# Patient Record
Sex: Male | Born: 1965 | Race: White | Hispanic: Yes | Smoking: Current every day smoker
Health system: Southern US, Community
[De-identification: ages and names within clinical notes are randomized; demographics above are authoritative.]

## PROBLEM LIST (undated history)

## (undated) DIAGNOSIS — K429 Umbilical hernia without obstruction or gangrene: Secondary | ICD-10-CM

## (undated) DIAGNOSIS — Z8719 Personal history of other diseases of the digestive system: Secondary | ICD-10-CM

## (undated) DIAGNOSIS — S92402A Displaced unspecified fracture of left great toe, initial encounter for closed fracture: Secondary | ICD-10-CM

## (undated) DIAGNOSIS — M24569 Contracture, unspecified knee: Secondary | ICD-10-CM

## (undated) DIAGNOSIS — Z9889 Other specified postprocedural states: Secondary | ICD-10-CM

## (undated) DIAGNOSIS — E349 Endocrine disorder, unspecified: Secondary | ICD-10-CM

## (undated) HISTORY — PX: HERNIA REPAIR: SHX51

## (undated) HISTORY — PX: ROTATOR CUFF REPAIR: SHX139

---

## 1999-01-19 ENCOUNTER — Ambulatory Visit (HOSPITAL_BASED_OUTPATIENT_CLINIC_OR_DEPARTMENT_OTHER): Admission: RE | Admit: 1999-01-19 | Discharge: 1999-01-19 | Payer: Self-pay | Admitting: *Deleted

## 2008-01-17 ENCOUNTER — Emergency Department (HOSPITAL_COMMUNITY): Admission: EM | Admit: 2008-01-17 | Discharge: 2008-01-17 | Payer: Self-pay | Admitting: Emergency Medicine

## 2011-06-12 ENCOUNTER — Encounter (HOSPITAL_COMMUNITY): Payer: Self-pay | Admitting: Emergency Medicine

## 2011-06-12 ENCOUNTER — Emergency Department (HOSPITAL_COMMUNITY): Payer: Managed Care, Other (non HMO)

## 2011-06-12 ENCOUNTER — Emergency Department (HOSPITAL_COMMUNITY)
Admission: EM | Admit: 2011-06-12 | Discharge: 2011-06-12 | Disposition: A | Discharge status: Home / Self Care | Payer: Managed Care, Other (non HMO) | Attending: Emergency Medicine | Admitting: Emergency Medicine

## 2011-06-12 DIAGNOSIS — M2420 Disorder of ligament, unspecified site: Secondary | ICD-10-CM | POA: Insufficient documentation

## 2011-06-12 DIAGNOSIS — F17200 Nicotine dependence, unspecified, uncomplicated: Secondary | ICD-10-CM | POA: Insufficient documentation

## 2011-06-12 DIAGNOSIS — M24212 Disorder of ligament, left shoulder: Secondary | ICD-10-CM

## 2011-06-12 DIAGNOSIS — M25519 Pain in unspecified shoulder: Secondary | ICD-10-CM | POA: Insufficient documentation

## 2011-06-12 HISTORY — DX: Umbilical hernia without obstruction or gangrene: K42.9

## 2011-06-12 MED ORDER — IBUPROFEN 800 MG PO TABS
800.0000 mg | ORAL_TABLET | Freq: Once | ORAL | Status: AC
  Administered 2011-06-12: 800 mg via ORAL
  Filled 2011-06-12: qty 1

## 2011-06-12 MED ORDER — IBUPROFEN 800 MG PO TABS: 800.0000 mg | ORAL_TABLET | Freq: Three times a day (TID) | ORAL | Status: AC

## 2011-06-12 MED ORDER — OXYCODONE-ACETAMINOPHEN 5-325 MG PO TABS: 2.0000 | ORAL_TABLET | ORAL | Status: AC | PRN

## 2011-06-12 MED ORDER — OXYCODONE-ACETAMINOPHEN 5-325 MG PO TABS
1.0000 | ORAL_TABLET | Freq: Once | ORAL | Status: AC
  Administered 2011-06-12: 1 via ORAL
  Filled 2011-06-12: qty 1

## 2011-06-12 NOTE — ED Provider Notes (Signed)
History     CSN: [id number]  Arrival date & time 06/12/11  1710   First MD Initiated Contact with Patient 06/12/11 1727      Chief Complaint  Patient presents with  . Shoulder Pain    (Consider location/radiation/quality/duration/timing/severity/associated sxs/prior treatment) Patient is a 46 y.o. male presenting with shoulder pain. The history is provided by the patient. No language interpreter was used.  Shoulder Pain This is a new problem. The current episode started yesterday. The problem occurs constantly. The problem has been gradually worsening. Associated symptoms include joint swelling. Pertinent negatives include no chest pain, fever, neck pain, numbness, vomiting or weakness. Exacerbated by: abduction and adduction. He has tried ice for the symptoms. The treatment provided no relief.  Reports moving a generator in his garage last night around 6pm and now he can not raise his left arm.  Attempted to work today but ended up hurting the shoulder again and now he cant move the LUE at all without pain. Good pulse and sensation.   Past Medical History  Diagnosis Date  . Umbilical hernia     Past Surgical History  Procedure Date  . Hernia repair     No family history on file.  History  Substance Use Topics  . Smoking status: Current Everyday Smoker  . Smokeless tobacco: Not on file  . Alcohol Use: Yes      Review of Systems  Constitutional: Negative for fever.  HENT: Negative for neck pain.   Cardiovascular: Negative for chest pain.  Gastrointestinal: Negative for vomiting.  Musculoskeletal: Positive for joint swelling.  Neurological: Negative for weakness and numbness.  All other systems reviewed and are negative.    Allergies  Review of patient's allergies indicates no known allergies.  Home Medications  No current outpatient prescriptions on file.  BP 131/46  Pulse 85  Temp(Src) 98.8 F (37.1 C) (Oral)  SpO2 98%  Physical Exam  Nursing note and  vitals reviewed. Constitutional: He is oriented to person, place, and time. He appears well-developed and well-nourished.  HENT:  Head: Normocephalic.  Eyes: Pupils are equal, round, and reactive to light.  Neck: Normal range of motion.  Cardiovascular: Normal rate.   Pulmonary/Chest: Effort normal.  Musculoskeletal:       L shoulder tenderness.  Unable to raise extremity.  Good pulse and sensation.l  Neurological: He is alert and oriented to person, place, and time.  Skin: Skin is warm and dry.  Psychiatric: He has a normal mood and affect.    ED Course  Procedures (including critical care time)  Labs Reviewed - No data to display No results found.   No diagnosis found.    MDM  L Shoulder pain since last pm when he was moving a generator in his garage.  Reinjured today at work.  Shoulder immobilizer and ortho consult follow up.  Ibuprofen and percocet for pain.  2+ L radial  Pulse.  Good sensation to the extremity.         Jethro Bastos, NP 06/13/11 1914

## 2011-06-12 NOTE — ED Notes (Signed)
Patient transported to X-ray 

## 2011-06-14 NOTE — ED Provider Notes (Signed)
Medical screening examination/treatment/procedure(s) were performed by non-physician practitioner and as supervising physician I was immediately available for consultation/collaboration.  Waleed Dettman, MD 06/14/11 1400 

## 2014-09-30 ENCOUNTER — Emergency Department (HOSPITAL_COMMUNITY): Payer: 59

## 2014-09-30 ENCOUNTER — Emergency Department (HOSPITAL_COMMUNITY): Payer: 59 | Admitting: Anesthesiology

## 2014-09-30 ENCOUNTER — Encounter (HOSPITAL_COMMUNITY): Admission: EM | Disposition: A | Discharge status: Rehabilitation Facility | Payer: Self-pay | Source: Home / Self Care

## 2014-09-30 ENCOUNTER — Encounter (HOSPITAL_COMMUNITY): Payer: Self-pay | Admitting: Radiology

## 2014-09-30 ENCOUNTER — Inpatient Hospital Stay (HOSPITAL_COMMUNITY)
Admission: EM | Admit: 2014-09-30 | Discharge: 2014-10-07 | DRG: 956 | Disposition: A | Discharge status: Rehabilitation Facility | Payer: 59 | Attending: General Surgery | Admitting: General Surgery

## 2014-09-30 DIAGNOSIS — S27321A Contusion of lung, unilateral, initial encounter: Secondary | ICD-10-CM | POA: Diagnosis present

## 2014-09-30 DIAGNOSIS — D62 Acute posthemorrhagic anemia: Secondary | ICD-10-CM | POA: Diagnosis present

## 2014-09-30 DIAGNOSIS — Z419 Encounter for procedure for purposes other than remedying health state, unspecified: Secondary | ICD-10-CM

## 2014-09-30 DIAGNOSIS — R300 Dysuria: Secondary | ICD-10-CM | POA: Diagnosis not present

## 2014-09-30 DIAGNOSIS — E559 Vitamin D deficiency, unspecified: Secondary | ICD-10-CM | POA: Diagnosis present

## 2014-09-30 DIAGNOSIS — Y92410 Unspecified street and highway as the place of occurrence of the external cause: Secondary | ICD-10-CM

## 2014-09-30 DIAGNOSIS — R000 Tachycardia, unspecified: Secondary | ICD-10-CM | POA: Diagnosis present

## 2014-09-30 DIAGNOSIS — Z23 Encounter for immunization: Secondary | ICD-10-CM | POA: Diagnosis not present

## 2014-09-30 DIAGNOSIS — S7292XS Unspecified fracture of left femur, sequela: Secondary | ICD-10-CM | POA: Diagnosis not present

## 2014-09-30 DIAGNOSIS — S32409A Unspecified fracture of unspecified acetabulum, initial encounter for closed fracture: Secondary | ICD-10-CM

## 2014-09-30 DIAGNOSIS — S32462A Displaced associated transverse-posterior fracture of left acetabulum, initial encounter for closed fracture: Secondary | ICD-10-CM | POA: Diagnosis present

## 2014-09-30 DIAGNOSIS — T1490 Injury, unspecified: Secondary | ICD-10-CM | POA: Diagnosis present

## 2014-09-30 DIAGNOSIS — F17210 Nicotine dependence, cigarettes, uncomplicated: Secondary | ICD-10-CM | POA: Diagnosis present

## 2014-09-30 DIAGNOSIS — S72402C Unspecified fracture of lower end of left femur, initial encounter for open fracture type IIIA, IIIB, or IIIC: Secondary | ICD-10-CM

## 2014-09-30 DIAGNOSIS — M79641 Pain in right hand: Secondary | ICD-10-CM | POA: Diagnosis present

## 2014-09-30 DIAGNOSIS — R52 Pain, unspecified: Secondary | ICD-10-CM

## 2014-09-30 DIAGNOSIS — M6100 Myositis ossificans traumatica, unspecified site: Secondary | ICD-10-CM | POA: Diagnosis present

## 2014-09-30 DIAGNOSIS — M6149 Other calcification of muscle, multiple sites: Secondary | ICD-10-CM | POA: Diagnosis present

## 2014-09-30 DIAGNOSIS — T1490XA Injury, unspecified, initial encounter: Secondary | ICD-10-CM

## 2014-09-30 DIAGNOSIS — S72402B Unspecified fracture of lower end of left femur, initial encounter for open fracture type I or II: Principal | ICD-10-CM | POA: Diagnosis present

## 2014-09-30 DIAGNOSIS — S72409A Unspecified fracture of lower end of unspecified femur, initial encounter for closed fracture: Secondary | ICD-10-CM

## 2014-09-30 DIAGNOSIS — S32402A Unspecified fracture of left acetabulum, initial encounter for closed fracture: Secondary | ICD-10-CM

## 2014-09-30 DIAGNOSIS — E871 Hypo-osmolality and hyponatremia: Secondary | ICD-10-CM | POA: Diagnosis not present

## 2014-09-30 DIAGNOSIS — S3692XA Contusion of unspecified intra-abdominal organ, initial encounter: Secondary | ICD-10-CM | POA: Diagnosis present

## 2014-09-30 DIAGNOSIS — S32402S Unspecified fracture of left acetabulum, sequela: Secondary | ICD-10-CM | POA: Diagnosis not present

## 2014-09-30 DIAGNOSIS — I872 Venous insufficiency (chronic) (peripheral): Secondary | ICD-10-CM | POA: Diagnosis present

## 2014-09-30 DIAGNOSIS — S7292XB Unspecified fracture of left femur, initial encounter for open fracture type I or II: Secondary | ICD-10-CM | POA: Diagnosis present

## 2014-09-30 HISTORY — DX: Displaced unspecified fracture of left great toe, initial encounter for closed fracture: S92.402A

## 2014-09-30 HISTORY — DX: Personal history of other diseases of the digestive system: Z87.19

## 2014-09-30 HISTORY — DX: Other specified postprocedural states: Z98.890

## 2014-09-30 HISTORY — PX: I & D EXTREMITY: SHX5045

## 2014-09-30 HISTORY — PX: EXTERNAL FIXATION LEG: SHX1549

## 2014-09-30 HISTORY — PX: HIP CLOSED REDUCTION: SHX983

## 2014-09-30 LAB — PREPARE FRESH FROZEN PLASMA
Unit division: 0
Unit division: 0

## 2014-09-30 LAB — CBC WITH DIFFERENTIAL/PLATELET
BASOS ABS: 0 10*3/uL (ref 0.0–0.1)
Basophils Relative: 0 % (ref 0–1)
Eosinophils Absolute: 0.2 10*3/uL (ref 0.0–0.7)
Eosinophils Relative: 2 % (ref 0–5)
HCT: 25.5 % — ABNORMAL LOW (ref 39.0–52.0)
Hemoglobin: 7.9 g/dL — ABNORMAL LOW (ref 13.0–17.0)
LYMPHS ABS: 1.2 10*3/uL (ref 0.7–4.0)
Lymphocytes Relative: 13 % (ref 12–46)
MCH: 22.8 pg — AB (ref 26.0–34.0)
MCHC: 31 g/dL (ref 30.0–36.0)
MCV: 73.7 fL — ABNORMAL LOW (ref 78.0–100.0)
Monocytes Absolute: 0.7 10*3/uL (ref 0.1–1.0)
Monocytes Relative: 7 % (ref 3–12)
NEUTROS PCT: 78 % — AB (ref 43–77)
Neutro Abs: 7.3 10*3/uL (ref 1.7–7.7)
PLATELETS: 317 10*3/uL (ref 150–400)
RBC: 3.46 MIL/uL — AB (ref 4.22–5.81)
RDW: 17.8 % — AB (ref 11.5–15.5)
WBC: 9.4 10*3/uL (ref 4.0–10.5)

## 2014-09-30 LAB — URINALYSIS, ROUTINE W REFLEX MICROSCOPIC
BILIRUBIN URINE: NEGATIVE
Glucose, UA: NEGATIVE mg/dL
Ketones, ur: NEGATIVE mg/dL
Leukocytes, UA: NEGATIVE
Nitrite: NEGATIVE
PROTEIN: NEGATIVE mg/dL
Specific Gravity, Urine: 1.003 — ABNORMAL LOW (ref 1.005–1.030)
UROBILINOGEN UA: 0.2 mg/dL (ref 0.0–1.0)
pH: 6.5 (ref 5.0–8.0)

## 2014-09-30 LAB — COMPREHENSIVE METABOLIC PANEL
ALBUMIN: 3.8 g/dL (ref 3.5–5.0)
ALT: 93 U/L — AB (ref 17–63)
AST: 312 U/L — AB (ref 15–41)
Alkaline Phosphatase: 57 U/L (ref 38–126)
Anion gap: 13 (ref 5–15)
BUN: 7 mg/dL (ref 6–20)
CALCIUM: 8.6 mg/dL — AB (ref 8.9–10.3)
CO2: 19 mmol/L — AB (ref 22–32)
CREATININE: 0.88 mg/dL (ref 0.61–1.24)
Chloride: 106 mmol/L (ref 101–111)
GFR calc Af Amer: >60 mL/min (ref >60)
Glucose, Bld: 114 mg/dL — ABNORMAL HIGH (ref 70–99)
Potassium: 3.4 mmol/L — ABNORMAL LOW (ref 3.5–5.1)
SODIUM: 138 mmol/L (ref 135–145)
TOTAL PROTEIN: 6.4 g/dL — AB (ref 6.5–8.1)
Total Bilirubin: 0.5 mg/dL (ref 0.3–1.2)

## 2014-09-30 LAB — RAPID URINE DRUG SCREEN, HOSP PERFORMED
Amphetamines: NOT DETECTED
Barbiturates: NOT DETECTED
Benzodiazepines: NOT DETECTED
Cocaine: NOT DETECTED
OPIATES: NOT DETECTED
Tetrahydrocannabinol: POSITIVE — AB

## 2014-09-30 LAB — PROTIME-INR
INR: 1.07 (ref 0.00–1.49)
Prothrombin Time: 14.1 seconds (ref 11.6–15.2)

## 2014-09-30 LAB — ABO/RH: ABO/RH(D): A POS

## 2014-09-30 LAB — ETHANOL: ALCOHOL ETHYL (B): 92 mg/dL — AB (ref <5)

## 2014-09-30 LAB — URINE MICROSCOPIC-ADD ON

## 2014-09-30 LAB — CDS SEROLOGY

## 2014-09-30 LAB — I-STAT CG4 LACTIC ACID, ED: LACTIC ACID, VENOUS: 3.09 mmol/L — AB (ref 0.5–2.0)

## 2014-09-30 SURGERY — IRRIGATION AND DEBRIDEMENT EXTREMITY
Anesthesia: General | Site: Leg Upper | Laterality: Left

## 2014-09-30 MED ORDER — KCL IN DEXTROSE-NACL 20-5-0.45 MEQ/L-%-% IV SOLN
INTRAVENOUS | Status: DC
  Administered 2014-10-01 – 2014-10-07 (×11): via INTRAVENOUS
  Filled 2014-09-30 (×16): qty 1000

## 2014-09-30 MED ORDER — SODIUM CHLORIDE 0.9 % IR SOLN
Status: DC | PRN
  Administered 2014-09-30: 1000 mL
  Administered 2014-09-30: 3000 mL

## 2014-09-30 MED ORDER — MIDAZOLAM HCL 2 MG/2ML IJ SOLN
INTRAMUSCULAR | Status: DC | PRN
  Administered 2014-09-30: 2 mg via INTRAVENOUS

## 2014-09-30 MED ORDER — ONDANSETRON HCL 4 MG/2ML IJ SOLN: 4.0000 mg | Freq: Four times a day (QID) | INTRAMUSCULAR | Status: DC | PRN

## 2014-09-30 MED ORDER — FENTANYL CITRATE (PF) 250 MCG/5ML IJ SOLN
INTRAMUSCULAR | Status: DC | PRN
  Administered 2014-09-30: 100 ug via INTRAVENOUS
  Administered 2014-09-30: 50 ug via INTRAVENOUS

## 2014-09-30 MED ORDER — IOHEXOL 300 MG/ML  SOLN
100.0000 mL | Freq: Once | INTRAMUSCULAR | Status: AC | PRN
  Administered 2014-09-30: 100 mL via INTRAVENOUS

## 2014-09-30 MED ORDER — OXYCODONE HCL 5 MG PO TABS: 5.0000 mg | ORAL_TABLET | Freq: Once | ORAL | Status: AC | PRN

## 2014-09-30 MED ORDER — SODIUM CHLORIDE 0.9 % IJ SOLN: 9.0000 mL | INTRAMUSCULAR | Status: DC | PRN

## 2014-09-30 MED ORDER — HYDROMORPHONE HCL 1 MG/ML IJ SOLN
INTRAMUSCULAR | Status: DC
Start: 2014-09-30 — End: 2014-10-01
  Filled 2014-09-30: qty 1

## 2014-09-30 MED ORDER — SUCCINYLCHOLINE CHLORIDE 20 MG/ML IJ SOLN
INTRAMUSCULAR | Status: DC | PRN
  Administered 2014-09-30: 100 mg via INTRAVENOUS

## 2014-09-30 MED ORDER — HYDROMORPHONE HCL 1 MG/ML IJ SOLN
1.0000 mg | Freq: Once | INTRAMUSCULAR | Status: AC
  Administered 2014-09-30: 1 mg via INTRAVENOUS

## 2014-09-30 MED ORDER — DIPHENHYDRAMINE HCL 12.5 MG/5ML PO ELIX
12.5000 mg | ORAL_SOLUTION | Freq: Four times a day (QID) | ORAL | Status: DC | PRN
  Filled 2014-09-30: qty 5

## 2014-09-30 MED ORDER — MORPHINE SULFATE (PF) 1 MG/ML IV SOLN
INTRAVENOUS | Status: AC
  Filled 2014-09-30: qty 25

## 2014-09-30 MED ORDER — HYDROMORPHONE HCL 1 MG/ML IJ SOLN
0.2500 mg | INTRAMUSCULAR | Status: DC | PRN
  Administered 2014-10-01 (×2): 0.5 mg via INTRAVENOUS

## 2014-09-30 MED ORDER — MORPHINE SULFATE (PF) 1 MG/ML IV SOLN
INTRAVENOUS | Status: DC
  Administered 2014-10-01: via INTRAVENOUS

## 2014-09-30 MED ORDER — TETANUS-DIPHTH-ACELL PERTUSSIS 5-2.5-18.5 LF-MCG/0.5 IM SUSP
INTRAMUSCULAR | Status: AC
Start: 2014-09-30 — End: 2014-09-30
  Administered 2014-09-30: 0.5 mL via INTRAMUSCULAR
  Filled 2014-09-30: qty 0.5

## 2014-09-30 MED ORDER — PROPOFOL 10 MG/ML IV BOLUS
INTRAVENOUS | Status: DC | PRN
  Administered 2014-09-30: 180 mg via INTRAVENOUS

## 2014-09-30 MED ORDER — ONDANSETRON HCL 4 MG/2ML IJ SOLN
INTRAMUSCULAR | Status: AC
  Filled 2014-09-30: qty 2

## 2014-09-30 MED ORDER — OXYCODONE HCL 5 MG/5ML PO SOLN: 5.0000 mg | Freq: Once | ORAL | Status: AC | PRN

## 2014-09-30 MED ORDER — HYDROMORPHONE HCL 1 MG/ML IJ SOLN
INTRAMUSCULAR | Status: AC
  Filled 2014-09-30: qty 1

## 2014-09-30 MED ORDER — DIPHENHYDRAMINE HCL 50 MG/ML IJ SOLN: 12.5000 mg | Freq: Four times a day (QID) | INTRAMUSCULAR | Status: DC | PRN

## 2014-09-30 MED ORDER — KCL IN DEXTROSE-NACL 20-5-0.45 MEQ/L-%-% IV SOLN
INTRAVENOUS | Status: AC
  Filled 2014-09-30: qty 1000

## 2014-09-30 MED ORDER — SUCCINYLCHOLINE CHLORIDE 20 MG/ML IJ SOLN
INTRAMUSCULAR | Status: AC
  Filled 2014-09-30: qty 1

## 2014-09-30 MED ORDER — CEFAZOLIN SODIUM 1-5 GM-% IV SOLN
1.0000 g | Freq: Once | INTRAVENOUS | Status: AC
  Administered 2014-09-30: 1 g via INTRAVENOUS
  Filled 2014-09-30: qty 50

## 2014-09-30 MED ORDER — LIDOCAINE HCL (CARDIAC) 20 MG/ML IV SOLN
INTRAVENOUS | Status: AC
Start: 2014-09-30 — End: 2014-09-30
  Filled 2014-09-30: qty 5

## 2014-09-30 MED ORDER — NALOXONE HCL 0.4 MG/ML IJ SOLN: 0.4000 mg | INTRAMUSCULAR | Status: DC | PRN

## 2014-09-30 MED ORDER — LACTATED RINGERS IV SOLN
INTRAVENOUS | Status: DC | PRN
  Administered 2014-09-30 (×2): via INTRAVENOUS

## 2014-09-30 MED ORDER — MIDAZOLAM HCL 2 MG/2ML IJ SOLN
INTRAMUSCULAR | Status: AC
  Filled 2014-09-30: qty 2

## 2014-09-30 MED ORDER — CEFAZOLIN SODIUM-DEXTROSE 2-3 GM-% IV SOLR
INTRAVENOUS | Status: DC | PRN
  Administered 2014-09-30: 2 g via INTRAVENOUS

## 2014-09-30 MED ORDER — FENTANYL CITRATE (PF) 250 MCG/5ML IJ SOLN
INTRAMUSCULAR | Status: AC
  Filled 2014-09-30: qty 5

## 2014-09-30 MED ORDER — HYDROMORPHONE HCL 1 MG/ML IJ SOLN
INTRAMUSCULAR | Status: AC
  Administered 2014-09-30: 1 mg
  Filled 2014-09-30: qty 1

## 2014-09-30 MED ORDER — LIDOCAINE HCL (CARDIAC) 20 MG/ML IV SOLN
INTRAVENOUS | Status: DC | PRN
  Administered 2014-09-30: 60 mg via INTRAVENOUS

## 2014-09-30 MED ORDER — ONDANSETRON HCL 4 MG/2ML IJ SOLN
INTRAMUSCULAR | Status: DC | PRN
  Administered 2014-09-30: 4 mg via INTRAVENOUS

## 2014-09-30 MED ORDER — SODIUM CHLORIDE 0.9 % IV SOLN
Freq: Once | INTRAVENOUS | Status: AC
  Administered 2014-09-30: 20:00:00 via INTRAVENOUS

## 2014-09-30 MED ORDER — PROPOFOL 10 MG/ML IV BOLUS
INTRAVENOUS | Status: AC
  Filled 2014-09-30: qty 20

## 2014-09-30 SURGICAL SUPPLY — 80 items
5MM HALF PIN 200 X 45 ×4 IMPLANT
BANDAGE ELASTIC 4 VELCRO ST LF (GAUZE/BANDAGES/DRESSINGS) ×4 IMPLANT
BANDAGE ELASTIC 6 VELCRO ST LF (GAUZE/BANDAGES/DRESSINGS) ×4 IMPLANT
BANDAGE ESMARK 6X9 LF (GAUZE/BANDAGES/DRESSINGS) ×2 IMPLANT
BAR EXFX 600X11 NS LF (MISCELLANEOUS) ×6
BAR GLASS FIBER EXFX 11X600 (MISCELLANEOUS) ×6 IMPLANT
BNDG CMPR 9X6 STRL LF SNTH (GAUZE/BANDAGES/DRESSINGS)
BNDG COHESIVE 6X5 TAN STRL LF (GAUZE/BANDAGES/DRESSINGS) ×4 IMPLANT
BNDG ESMARK 6X9 LF (GAUZE/BANDAGES/DRESSINGS)
BNDG GAUZE ELAST 4 BULKY (GAUZE/BANDAGES/DRESSINGS) ×8 IMPLANT
BOOTCOVER CLEANROOM LRG (PROTECTIVE WEAR) ×4 IMPLANT
BRUSH SCRUB DISP (MISCELLANEOUS) ×4 IMPLANT
CLEANER TIP ELECTROSURG 2X2 (MISCELLANEOUS) ×4 IMPLANT
CLOSURE WOUND 1/2 X4 (GAUZE/BANDAGES/DRESSINGS)
COVER SURGICAL LIGHT HANDLE (MISCELLANEOUS) ×6 IMPLANT
CUFF TOURNIQUET SINGLE 18IN (TOURNIQUET CUFF) IMPLANT
CUFF TOURNIQUET SINGLE 24IN (TOURNIQUET CUFF) IMPLANT
CUFF TOURNIQUET SINGLE 34IN LL (TOURNIQUET CUFF) IMPLANT
DRAPE C-ARM 42X72 X-RAY (DRAPES) ×2 IMPLANT
DRAPE C-ARMOR (DRAPES) ×2 IMPLANT
DRAPE U-SHAPE 47X51 STRL (DRAPES) ×4 IMPLANT
DRSG ADAPTIC 3X8 NADH LF (GAUZE/BANDAGES/DRESSINGS) ×4 IMPLANT
DRSG MEPILEX BORDER 4X8 (GAUZE/BANDAGES/DRESSINGS) IMPLANT
DRSG PAD ABDOMINAL 8X10 ST (GAUZE/BANDAGES/DRESSINGS) ×6 IMPLANT
DURAPREP 26ML APPLICATOR (WOUND CARE) ×4 IMPLANT
ELECT REM PT RETURN 9FT ADLT (ELECTROSURGICAL) ×4
ELECTRODE REM PT RTRN 9FT ADLT (ELECTROSURGICAL) ×2 IMPLANT
EVACUATOR 1/8 PVC DRAIN (DRAIN) IMPLANT
GAUZE SPONGE 4X4 12PLY STRL (GAUZE/BANDAGES/DRESSINGS) ×4 IMPLANT
GAUZE XEROFORM 1X8 LF (GAUZE/BANDAGES/DRESSINGS) ×2 IMPLANT
GAUZE XEROFORM 5X9 LF (GAUZE/BANDAGES/DRESSINGS) ×2 IMPLANT
GLOVE BIO SURGEON STRL SZ7.5 (GLOVE) ×2 IMPLANT
GLOVE BIO SURGEON STRL SZ8 (GLOVE) ×8 IMPLANT
GLOVE BIOGEL PI IND STRL 7.5 (GLOVE) ×2 IMPLANT
GLOVE BIOGEL PI IND STRL 8 (GLOVE) ×2 IMPLANT
GLOVE BIOGEL PI INDICATOR 7.5 (GLOVE)
GLOVE BIOGEL PI INDICATOR 8 (GLOVE)
GOWN STRL REUS W/ TWL LRG LVL3 (GOWN DISPOSABLE) ×4 IMPLANT
GOWN STRL REUS W/ TWL XL LVL3 (GOWN DISPOSABLE) ×2 IMPLANT
GOWN STRL REUS W/TWL LRG LVL3 (GOWN DISPOSABLE)
GOWN STRL REUS W/TWL XL LVL3 (GOWN DISPOSABLE) ×4
HANDPIECE INTERPULSE COAX TIP (DISPOSABLE)
KIT BASIN OR (CUSTOM PROCEDURE TRAY) ×4 IMPLANT
KIT ROOM TURNOVER OR (KITS) ×4 IMPLANT
MANIFOLD NEPTUNE II (INSTRUMENTS) ×4 IMPLANT
NEEDLE 22X1 1/2 (OR ONLY) (NEEDLE) IMPLANT
NS IRRIG 1000ML POUR BTL (IV SOLUTION) ×4 IMPLANT
PACK ORTHO EXTREMITY (CUSTOM PROCEDURE TRAY) ×4 IMPLANT
PACK SHOULDER (CUSTOM PROCEDURE TRAY) ×2 IMPLANT
PAD ABD 8X10 STRL (GAUZE/BANDAGES/DRESSINGS) ×2 IMPLANT
PAD ARMBOARD 7.5X6 YLW CONV (MISCELLANEOUS) ×8 IMPLANT
PADDING CAST COTTON 6X4 STRL (CAST SUPPLIES) ×6 IMPLANT
PIN CLAMP 2BAR 75MM BLUE (PIN) ×4 IMPLANT
PIN HALF YELLOW 5X160X35 (PIN) ×4 IMPLANT
SET HNDPC FAN SPRY TIP SCT (DISPOSABLE) IMPLANT
SPONGE GAUZE 4X4 12PLY STER LF (GAUZE/BANDAGES/DRESSINGS) ×4 IMPLANT
SPONGE LAP 18X18 X RAY DECT (DISPOSABLE) ×4 IMPLANT
SPONGE SCRUB IODOPHOR (GAUZE/BANDAGES/DRESSINGS) ×4 IMPLANT
STAPLER VISISTAT 35W (STAPLE) IMPLANT
STOCKINETTE IMPERVIOUS LG (DRAPES) ×4 IMPLANT
STRIP CLOSURE SKIN 1/2X4 (GAUZE/BANDAGES/DRESSINGS) IMPLANT
SUCTION FRAZIER TIP 10 FR DISP (SUCTIONS) IMPLANT
SUPPORT WRAP ARM LG (MISCELLANEOUS) IMPLANT
SUT CHROMIC 3 0 SH 27 (SUTURE) ×2 IMPLANT
SUT ETHILON 2 0 PSLX (SUTURE) ×4 IMPLANT
SUT ETHILON 3 0 PS 1 (SUTURE) IMPLANT
SUT VIC AB 0 CT1 27 (SUTURE)
SUT VIC AB 0 CT1 27XBRD ANBCTR (SUTURE) ×4 IMPLANT
SUT VIC AB 2-0 CT1 27 (SUTURE)
SUT VIC AB 2-0 CT1 TAPERPNT 27 (SUTURE) ×4 IMPLANT
SWAB COLLECTION DEVICE MRSA (MISCELLANEOUS) ×2 IMPLANT
SYR CONTROL 10ML LL (SYRINGE) IMPLANT
TOWEL OR 17X24 6PK STRL BLUE (TOWEL DISPOSABLE) ×10 IMPLANT
TOWEL OR 17X26 10 PK STRL BLUE (TOWEL DISPOSABLE) ×8 IMPLANT
TUBE ANAEROBIC SPECIMEN COL (MISCELLANEOUS) IMPLANT
TUBE CONNECTING 12'X1/4 (SUCTIONS) ×1
TUBE CONNECTING 12X1/4 (SUCTIONS) ×3 IMPLANT
UNDERPAD 30X30 INCONTINENT (UNDERPADS AND DIAPERS) ×4 IMPLANT
WATER STERILE IRR 1000ML POUR (IV SOLUTION) ×4 IMPLANT
YANKAUER SUCT BULB TIP NO VENT (SUCTIONS) ×4 IMPLANT
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
## 2014-09-30 NOTE — ED Notes (Signed)
Urine return in foley bag appears bloody.

## 2014-09-30 NOTE — ED Notes (Signed)
Pt to CT with RN on monitor.

## 2014-09-30 NOTE — ED Notes (Signed)
500 ml bolus infusing.
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
## 2014-09-30 NOTE — Anesthesia Procedure Notes (Signed)
Procedure Name: Intubation Date/Time: 09/30/2014 9:47 PM Performed by: Molli HazardGORDON, Doloris Servantes M Pre-anesthesia Checklist: Patient identified, Emergency Drugs available, Suction available and Patient being monitored Patient Re-evaluated:Patient Re-evaluated prior to inductionOxygen Delivery Method: Circle system utilized Preoxygenation: Pre-oxygenation with 100% oxygen Intubation Type: IV induction, Rapid sequence and Cricoid Pressure applied Laryngoscope Size: Miller and 2 Grade View: Grade I Tube type: Oral Tube size: 7.5 mm Number of attempts: 1 Airway Equipment and Method: Stylet Placement Confirmation: ETT inserted through vocal cords under direct vision,  positive ETCO2 and breath sounds checked- equal and bilateral Secured at: 23 cm Tube secured with: Tape Dental Injury: Teeth and Oropharynx as per pre-operative assessment
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
## 2014-09-30 NOTE — ED Notes (Signed)
Return from ct  Pt remains alert and oriented.

## 2014-09-30 NOTE — ED Notes (Signed)
Pt;s clothes taken with pt to OR

## 2014-09-30 NOTE — ED Notes (Signed)
Warm blankets applied to pt and temp turned up in trauma room

## 2014-09-30 NOTE — OR Nursing (Signed)
Pt removed disposable contract lenses and discarded them. cdb

## 2014-09-30 NOTE — ED Notes (Signed)
Hare traction splint being applied to left leg by Dr. Lindie SpruceWyatt and ortho tech at this time
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
## 2014-09-30 NOTE — ED Notes (Signed)
C-collar removed by Dr. Lindie SpruceWyatt.

## 2014-09-30 NOTE — Brief Op Note (Signed)
09/30/2014  11:33 PM  PATIENT:  Trixie Rudeichard Zarling  49 y.o. male  PRE-OPERATIVE DIAGNOSIS:  Open left Femur Fracture Dislocated Left Hip Fracture  POST-OPERATIVE DIAGNOSIS:  Open Left Femur Fracture, Dislocated left hip fracture  PROCEDURE:  Procedure(s): IRRIGATION AND DEBRIDEMENT EXTREMITY (Left) EXTERNAL FIXATION LEG (Left) CLOSED REDUCTION HIP (Left)  SURGEON:  Surgeon(s) and Role:    * Samson FredericBrian Dezyrae Kensinger, MD - Primary  PHYSICIAN ASSISTANT: Alphonsa OverallBrad Dixon, PA  ANESTHESIA:   general  EBL:  Total I/O In: 2100 [I.V.:2100] Out: 600 [Urine:600]  BLOOD ADMINISTERED:none  DRAINS: none   LOCAL MEDICATIONS USED:  NONE  SPECIMEN:  No Specimen  DISPOSITION OF SPECIMEN:  N/A  COUNTS:  YES  TOURNIQUET:  * No tourniquets in log *  DICTATION: .Other Dictation: Dictation Number [id number]  PLAN OF CARE: Admit to inpatient   PATIENT DISPOSITION:  PACU - hemodynamically stable.   Delay start of Pharmacological VTE agent (>24hrs) due to surgical blood loss or risk of bleeding: no

## 2014-09-30 NOTE — Transfer of Care (Signed)
Immediate Anesthesia Transfer of Care Note  Patient: [name] [name]  Procedure(s) Performed: Procedure(s): IRRIGATION AND DEBRIDEMENT EXTREMITY (Left) EXTERNAL FIXATION LEG (Left) CLOSED REDUCTION HIP (Left)  Patient Location: PACU  Anesthesia Type:General  Level of Consciousness: sedated and patient cooperative  Airway & Oxygen Therapy: Patient connected to nasal cannula oxygen  Post-op Assessment: Report given to RN and Post -op Vital signs reviewed and stable  Post vital signs: Reviewed and stable  Last Vitals:  Filed Vitals:   09/30/14 2345  BP: 139/80  Pulse: 125  Temp: 36.7 C  Resp: 19    Complications: No apparent anesthesia complications

## 2014-09-30 NOTE — ED Notes (Signed)
1 gold colored earing with clear stone, 1 black wallet, 1  $20.00 bill locked in safe

## 2014-09-30 NOTE — ED Notes (Signed)
To CT at this time.

## 2014-09-30 NOTE — ED Notes (Signed)
Pt remains alert and oriented.  Pedal pulse remains strong in left leg.
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
## 2014-09-30 NOTE — Anesthesia Preprocedure Evaluation (Signed)
Anesthesia Evaluation  Patient identified by MRN, date of birth, ID band Patient awake    Reviewed: Allergy & Precautions, NPO status , Patient's Chart, lab work & pertinent test results  Airway Mallampati: II   Neck ROM: full    Dental   Pulmonary Current Smoker,    breath sounds clear to auscultation       Cardiovascular negative cardio ROS   Rhythm:regular Rate:Normal     Neuro/Psych    GI/Hepatic   Endo/Other    Renal/GU      Musculoskeletal   Abdominal   Peds  Hematology   Anesthesia Other Findings   Reproductive/Obstetrics                             Anesthesia Physical Anesthesia Plan  ASA: II and emergent  Anesthesia Plan: General   Post-op Pain Management:    Induction: Intravenous  Airway Management Planned: Oral ETT  Additional Equipment:   Intra-op Plan:   Post-operative Plan: Extubation in OR  Informed Consent: I have reviewed the patients History and Physical, chart, labs and discussed the procedure including the risks, benefits and alternatives for the proposed anesthesia with the patient or authorized representative who has indicated his/her understanding and acceptance.     Plan Discussed with: CRNA, Anesthesiologist and Surgeon  Anesthesia Plan Comments:         Anesthesia Quick Evaluation  

## 2014-10-01 ENCOUNTER — Inpatient Hospital Stay (HOSPITAL_COMMUNITY): Payer: 59

## 2014-10-01 ENCOUNTER — Encounter (HOSPITAL_COMMUNITY): Payer: Self-pay | Admitting: *Deleted

## 2014-10-01 LAB — CBC
HCT: 30.7 % — ABNORMAL LOW (ref 39.0–52.0)
HCT: 28.6 % — ABNORMAL LOW (ref 39.0–52.0)
HEMOGLOBIN: 10.9 g/dL — AB (ref 13.0–17.0)
Hemoglobin: 9.7 g/dL — ABNORMAL LOW (ref 13.0–17.0)
MCH: 33.3 pg (ref 26.0–34.0)
MCH: 32.2 pg (ref 26.0–34.0)
MCHC: 35.5 g/dL (ref 30.0–36.0)
MCHC: 33.9 g/dL (ref 30.0–36.0)
MCV: 93.9 fL (ref 78.0–100.0)
MCV: 95 fL (ref 78.0–100.0)
Platelets: 246 10*3/uL (ref 150–400)
Platelets: 224 10*3/uL (ref 150–400)
RBC: 3.27 MIL/uL — AB (ref 4.22–5.81)
RBC: 3.01 MIL/uL — ABNORMAL LOW (ref 4.22–5.81)
RDW: 13.6 % (ref 11.5–15.5)
RDW: 13.7 % (ref 11.5–15.5)
WBC: 15.4 10*3/uL — ABNORMAL HIGH (ref 4.0–10.5)
WBC: 11.6 10*3/uL — ABNORMAL HIGH (ref 4.0–10.5)

## 2014-10-01 LAB — PROTIME-INR
INR: 1.08 (ref 0.00–1.49)
Prothrombin Time: 14.1 seconds (ref 11.6–15.2)

## 2014-10-01 LAB — BASIC METABOLIC PANEL
Anion gap: 8 (ref 5–15)
BUN: 6 mg/dL (ref 6–20)
CO2: 23 mmol/L (ref 22–32)
CREATININE: 0.83 mg/dL (ref 0.61–1.24)
Calcium: 7.6 mg/dL — ABNORMAL LOW (ref 8.9–10.3)
Chloride: 105 mmol/L (ref 101–111)
GFR calc Af Amer: >60 mL/min (ref >60)
Glucose, Bld: 208 mg/dL — ABNORMAL HIGH (ref 70–99)
Potassium: 4.3 mmol/L (ref 3.5–5.1)
SODIUM: 136 mmol/L (ref 135–145)

## 2014-10-01 LAB — MRSA PCR SCREENING: MRSA by PCR: NEGATIVE

## 2014-10-01 MED ORDER — ONDANSETRON HCL 4 MG/2ML IJ SOLN
4.0000 mg | Freq: Four times a day (QID) | INTRAMUSCULAR | Status: DC | PRN
Start: 2014-10-01 — End: 2014-10-07

## 2014-10-01 MED ORDER — METHOCARBAMOL 1000 MG/10ML IJ SOLN
1000.0000 mg | Freq: Four times a day (QID) | INTRAVENOUS | Status: DC
  Administered 2014-10-01 – 2014-10-07 (×20): 1000 mg via INTRAVENOUS
  Filled 2014-10-01 (×31): qty 10

## 2014-10-01 MED ORDER — DIPHENHYDRAMINE HCL 50 MG/ML IJ SOLN: 12.5000 mg | Freq: Four times a day (QID) | INTRAMUSCULAR | Status: DC | PRN

## 2014-10-01 MED ORDER — SODIUM CHLORIDE 0.9 % IJ SOLN: 9.0000 mL | INTRAMUSCULAR | Status: DC | PRN

## 2014-10-01 MED ORDER — HYDROMORPHONE 0.3 MG/ML IV SOLN
INTRAVENOUS | Status: AC
  Administered 2014-10-01: 1.5 mg
  Filled 2014-10-01: qty 25

## 2014-10-01 MED ORDER — CEFAZOLIN SODIUM 1-5 GM-% IV SOLN
1.0000 g | Freq: Four times a day (QID) | INTRAVENOUS | Status: DC
  Administered 2014-10-01 – 2014-10-02 (×8): 1 g via INTRAVENOUS
  Filled 2014-10-01 (×9): qty 50

## 2014-10-01 MED ORDER — NALOXONE HCL 0.4 MG/ML IJ SOLN: 0.4000 mg | INTRAMUSCULAR | Status: DC | PRN

## 2014-10-01 MED ORDER — METOCLOPRAMIDE HCL 5 MG PO TABS
5.0000 mg | ORAL_TABLET | Freq: Three times a day (TID) | ORAL | Status: DC | PRN
  Filled 2014-10-01: qty 2

## 2014-10-01 MED ORDER — HYDROMORPHONE 0.3 MG/ML IV SOLN
INTRAVENOUS | Status: DC
  Administered 2014-10-01: 3.9 mg via INTRAVENOUS
  Administered 2014-10-01: 17:00:00 via INTRAVENOUS
  Administered 2014-10-01: 3.9 mg via INTRAVENOUS
  Administered 2014-10-01: 3.74 mg via INTRAVENOUS
  Administered 2014-10-01: 3.6 mg via INTRAVENOUS
  Administered 2014-10-01 (×2): via INTRAVENOUS
  Administered 2014-10-01: 3.6 mg via INTRAVENOUS
  Administered 2014-10-02: 4.4 mg via INTRAVENOUS
  Administered 2014-10-02: 3.3 mg via INTRAVENOUS
  Administered 2014-10-02: 1.5 mg via INTRAVENOUS
  Administered 2014-10-02: 2.4 mg via INTRAVENOUS
  Administered 2014-10-03: 3.3 mg via INTRAVENOUS
  Administered 2014-10-03: 3.6 mg via INTRAVENOUS
  Administered 2014-10-03: 16:00:00 via INTRAVENOUS
  Administered 2014-10-03: 2.7 mg via INTRAVENOUS
  Administered 2014-10-03: 1.2 mg via INTRAVENOUS
  Administered 2014-10-03: 3.6 mg via INTRAVENOUS
  Administered 2014-10-04 (×2): via INTRAVENOUS
  Administered 2014-10-04 (×2): 1.8 mg via INTRAVENOUS
  Administered 2014-10-04: 2.7 mg via INTRAVENOUS
  Administered 2014-10-05 (×2): 1.8 mg via INTRAVENOUS
  Administered 2014-10-06: 1.2 mg via INTRAVENOUS
  Administered 2014-10-06: 3 mg via INTRAVENOUS
  Administered 2014-10-06: 1.2 mg via INTRAVENOUS
  Administered 2014-10-06: 0.3 mg via INTRAVENOUS
  Filled 2014-10-01 (×10): qty 25

## 2014-10-01 MED ORDER — ENOXAPARIN SODIUM 40 MG/0.4ML ~~LOC~~ SOLN
40.0000 mg | Freq: Every day | SUBCUTANEOUS | Status: DC
  Administered 2014-10-03 – 2014-10-07 (×5): 40 mg via SUBCUTANEOUS
  Filled 2014-10-01 (×4): qty 0.4

## 2014-10-01 MED ORDER — PNEUMOCOCCAL VAC POLYVALENT 25 MCG/0.5ML IJ INJ
0.5000 mL | INJECTION | INTRAMUSCULAR | Status: AC
  Administered 2014-10-02: 0.5 mL via INTRAMUSCULAR
  Filled 2014-10-01: qty 0.5

## 2014-10-01 MED ORDER — ENOXAPARIN SODIUM 40 MG/0.4ML ~~LOC~~ SOLN
40.0000 mg | Freq: Every day | SUBCUTANEOUS | Status: DC
  Filled 2014-10-01: qty 0.4

## 2014-10-01 MED ORDER — CEFAZOLIN SODIUM 1-5 GM-% IV SOLN
1.0000 g | Freq: Three times a day (TID) | INTRAVENOUS | Status: DC
  Filled 2014-10-01 (×2): qty 50

## 2014-10-01 MED ORDER — METOCLOPRAMIDE HCL 5 MG/ML IJ SOLN
5.0000 mg | Freq: Three times a day (TID) | INTRAMUSCULAR | Status: DC | PRN
  Filled 2014-10-01: qty 2

## 2014-10-01 MED ORDER — SODIUM CHLORIDE 0.9 % IV SOLN: INTRAVENOUS | Status: DC

## 2014-10-01 MED ORDER — DIPHENHYDRAMINE HCL 12.5 MG/5ML PO ELIX
12.5000 mg | ORAL_SOLUTION | Freq: Four times a day (QID) | ORAL | Status: DC | PRN
  Filled 2014-10-01: qty 5

## 2014-10-01 MED ORDER — OXYCODONE HCL 5 MG PO TABS
5.0000 mg | ORAL_TABLET | ORAL | Status: DC | PRN
  Administered 2014-10-01: 5 mg via ORAL
  Administered 2014-10-01 – 2014-10-06 (×22): 10 mg via ORAL
  Filled 2014-10-01 (×19): qty 2
  Filled 2014-10-01: qty 1
  Filled 2014-10-01 (×3): qty 2

## 2014-10-01 NOTE — Plan of Care (Signed)
Problem: Problem: Pain Management Progression Goal: Pain controlled Outcome: Progressing PCA dilaudid initiated. Goal: PAIN DECREASED WITH MOVEMENT OR ACTIVITY Outcome: Progressing Elevated affected extremity (L) on pillows. Turned on schedule.

## 2014-10-01 NOTE — Progress Notes (Signed)
Wasted in sink 12ml morphine PCA and witnessed by Danelle BerryJasmine Jones, RN.

## 2014-10-01 NOTE — Anesthesia Postprocedure Evaluation (Signed)
Anesthesia Post Note  Patient: [name] RudeRichard Bruhn  Procedure(s) Performed: Procedure(s) (LRB): IRRIGATION AND DEBRIDEMENT EXTREMITY (Left) EXTERNAL FIXATION LEG (Left) CLOSED REDUCTION HIP (Left)  Anesthesia type: General  Patient location: PACU  Post pain: Pain level controlled and Adequate analgesia  Post assessment: Post-op Vital signs reviewed, Patient's Cardiovascular Status Stable, Respiratory Function Stable, Patent Airway and Pain level controlled  Last Vitals:  Filed Vitals:   10/01/14 0500  BP: 142/72  Pulse:   Temp:   Resp: 11    Post vital signs: Reviewed and stable  Level of consciousness: awake, alert  and oriented  Complications: No apparent anesthesia complications

## 2014-10-01 NOTE — Progress Notes (Signed)
UR completed.  Pt to have further surgeries.  Await outcome of those and subsequent therapy evals to determine pt's next best level of care for discharge.   Carlyle LipaMichelle Dalissa Lovin, RN BSN [facility] CCM Trauma/Neuro ICU Case Manager [phone]

## 2014-10-01 NOTE — Progress Notes (Signed)
   Subjective:  Patient reports pain as moderate to severe.  States sensation to L foot is improved  Objective:   VITALS:   Filed Vitals:   10/01/14 0300 10/01/14 0400 10/01/14 0500 10/01/14 0600  BP: 132/81 127/68 142/72 131/68  Pulse:      Temp:  98.4 F (36.9 C)    TempSrc:  Oral    Resp: 15 13 11 10   Height:      Weight:      SpO2: 100% 100% 100% 100%    ABD soft Intact pulses distally Dorsiflexion/Plantar flexion intact Incision: dressing C/D/I Compartment soft sensation to dorsum of foot improved Ex fix intact, pinsites C/D/I  Lab Results  Component Value Date   WBC 15.4* 10/01/2014   HGB 10.9* 10/01/2014   HCT 30.7* 10/01/2014   MCV 93.9 10/01/2014   PLT 246 10/01/2014   BMET    Component Value Date/Time   NA 136 10/01/2014 0228   K 4.3 10/01/2014 0228   CL 105 10/01/2014 0228   CO2 23 10/01/2014 0228   GLUCOSE 208* 10/01/2014 0228   BUN 6 10/01/2014 0228   CREATININE 0.83 10/01/2014 0228   CALCIUM 7.6* 10/01/2014 0228   GFRNONAA >60 10/01/2014 0228   GFRAA >60 10/01/2014 0228     Assessment/Plan: 1 Day Post-Op   Active Problems:   Open femur fracture, left   Open femur fracture L transverse + posterior wall acetabular fx / dislocation   Bedrest for now DVT ppx: lovenox IV abx x72 hrs for open fx Needs CT L knee and pelvis for operative planning Will discuss with Dr. Carola FrostHandy who will perform definitive fixation   Aimee Heldman, Cloyde ReamsBrian [name] 10/01/2014, 6:29 AM   Samson FredericBrian Noela Brothers, MD Cell [phone]
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
## 2014-10-01 NOTE — Progress Notes (Signed)
Orthopedic Tech Progress Note Patient Details:  Trixie RudeRichard Chumley 02/10/1966 [id number]  Musculoskeletal Traction Type of Traction: Skeletal (Balanced Suspension) Traction Location: LLE Traction Weight: 25 lbs    Jennye MoccasinHughes, Francine Hannan Craig 10/01/2014, 3:38 PM

## 2014-10-02 ENCOUNTER — Encounter (HOSPITAL_COMMUNITY): Payer: Self-pay | Admitting: *Deleted

## 2014-10-02 ENCOUNTER — Telehealth: Payer: Self-pay | Admitting: *Deleted

## 2014-10-02 ENCOUNTER — Inpatient Hospital Stay (HOSPITAL_COMMUNITY): Payer: 59 | Admitting: Certified Registered Nurse Anesthetist

## 2014-10-02 ENCOUNTER — Inpatient Hospital Stay (HOSPITAL_COMMUNITY): Payer: 59

## 2014-10-02 ENCOUNTER — Encounter (HOSPITAL_COMMUNITY): Admission: EM | Disposition: A | Discharge status: Rehabilitation Facility | Payer: Self-pay | Source: Home / Self Care

## 2014-10-02 HISTORY — PX: I & D EXTREMITY: SHX5045

## 2014-10-02 HISTORY — PX: ORIF ACETABULAR FRACTURE: SHX5029

## 2014-10-02 LAB — GRAM STAIN

## 2014-10-02 LAB — CBC
HEMATOCRIT: 25.2 % — AB (ref 39.0–52.0)
HEMOGLOBIN: 8.6 g/dL — AB (ref 13.0–17.0)
MCH: 33.3 pg (ref 26.0–34.0)
MCHC: 34.1 g/dL (ref 30.0–36.0)
MCV: 97.7 fL (ref 78.0–100.0)
Platelets: 192 10*3/uL (ref 150–400)
RBC: 2.58 MIL/uL — ABNORMAL LOW (ref 4.22–5.81)
RDW: 13.7 % (ref 11.5–15.5)
WBC: 12.1 10*3/uL — ABNORMAL HIGH (ref 4.0–10.5)

## 2014-10-02 LAB — PREPARE RBC (CROSSMATCH)

## 2014-10-02 LAB — PROTIME-INR
INR: 1.1 (ref 0.00–1.49)
PROTHROMBIN TIME: 14.4 s (ref 11.6–15.2)

## 2014-10-02 LAB — APTT: APTT: 30 s (ref 24–37)

## 2014-10-02 SURGERY — IRRIGATION AND DEBRIDEMENT EXTREMITY
Anesthesia: General | Site: Leg Upper | Laterality: Left

## 2014-10-02 MED ORDER — FENTANYL CITRATE (PF) 250 MCG/5ML IJ SOLN
INTRAMUSCULAR | Status: AC
  Filled 2014-10-02: qty 5

## 2014-10-02 MED ORDER — LIDOCAINE HCL (CARDIAC) 20 MG/ML IV SOLN
INTRAVENOUS | Status: AC
  Filled 2014-10-02: qty 5

## 2014-10-02 MED ORDER — ONDANSETRON HCL 4 MG/2ML IJ SOLN
INTRAMUSCULAR | Status: AC
  Filled 2014-10-02: qty 2

## 2014-10-02 MED ORDER — MIDAZOLAM HCL 2 MG/2ML IJ SOLN
INTRAMUSCULAR | Status: AC
  Filled 2014-10-02: qty 2

## 2014-10-02 MED ORDER — SODIUM CHLORIDE 0.9 % IJ SOLN
INTRAMUSCULAR | Status: AC
  Filled 2014-10-02: qty 10

## 2014-10-02 MED ORDER — ARTIFICIAL TEARS OP OINT
TOPICAL_OINTMENT | OPHTHALMIC | Status: DC | PRN
  Administered 2014-10-02: 1 via OPHTHALMIC

## 2014-10-02 MED ORDER — PHENYLEPHRINE HCL 10 MG/ML IJ SOLN
INTRAMUSCULAR | Status: DC | PRN
  Administered 2014-10-02 (×3): 80 ug via INTRAVENOUS

## 2014-10-02 MED ORDER — GLYCOPYRROLATE 0.2 MG/ML IJ SOLN
INTRAMUSCULAR | Status: DC | PRN
  Administered 2014-10-02: .8 mg via INTRAVENOUS

## 2014-10-02 MED ORDER — LACTATED RINGERS IV SOLN
INTRAVENOUS | Status: DC
  Administered 2014-10-02 (×6): via INTRAVENOUS

## 2014-10-02 MED ORDER — ROCURONIUM BROMIDE 100 MG/10ML IV SOLN
INTRAVENOUS | Status: DC | PRN
  Administered 2014-10-02: 50 mg via INTRAVENOUS

## 2014-10-02 MED ORDER — TOBRAMYCIN SULFATE 1.2 G IJ SOLR
INTRAMUSCULAR | Status: AC
  Filled 2014-10-02: qty 1.2

## 2014-10-02 MED ORDER — VECURONIUM BROMIDE 10 MG IV SOLR
INTRAVENOUS | Status: AC
  Filled 2014-10-02: qty 10

## 2014-10-02 MED ORDER — 0.9 % SODIUM CHLORIDE (POUR BTL) OPTIME
TOPICAL | Status: DC | PRN
  Administered 2014-10-02 (×2): 1000 mL

## 2014-10-02 MED ORDER — NEOSTIGMINE METHYLSULFATE 10 MG/10ML IV SOLN
INTRAVENOUS | Status: AC
  Filled 2014-10-02: qty 1

## 2014-10-02 MED ORDER — LIDOCAINE HCL (CARDIAC) 20 MG/ML IV SOLN
INTRAVENOUS | Status: DC | PRN
  Administered 2014-10-02: 80 mg via INTRAVENOUS

## 2014-10-02 MED ORDER — OXYCODONE HCL 5 MG/5ML PO SOLN: 5.0000 mg | Freq: Once | ORAL | Status: AC | PRN

## 2014-10-02 MED ORDER — SODIUM CHLORIDE 0.9 % IR SOLN
Status: DC | PRN
  Administered 2014-10-02: 3000 mL

## 2014-10-02 MED ORDER — MIDAZOLAM HCL 5 MG/5ML IJ SOLN
INTRAMUSCULAR | Status: DC | PRN
  Administered 2014-10-02: 2 mg via INTRAVENOUS

## 2014-10-02 MED ORDER — HYDROMORPHONE 0.3 MG/ML IV SOLN
INTRAVENOUS | Status: DC
  Administered 2014-10-03: 01:00:00 via INTRAVENOUS

## 2014-10-02 MED ORDER — FENTANYL CITRATE (PF) 100 MCG/2ML IJ SOLN
INTRAMUSCULAR | Status: DC | PRN
  Administered 2014-10-02: 100 ug via INTRAVENOUS
  Administered 2014-10-02: 150 ug via INTRAVENOUS
  Administered 2014-10-02 (×2): 50 ug via INTRAVENOUS
  Administered 2014-10-02: 100 ug via INTRAVENOUS
  Administered 2014-10-02: 50 ug via INTRAVENOUS
  Administered 2014-10-02: 100 ug via INTRAVENOUS
  Administered 2014-10-02: 50 ug via INTRAVENOUS
  Administered 2014-10-02 (×2): 100 ug via INTRAVENOUS
  Administered 2014-10-02 (×4): 50 ug via INTRAVENOUS
  Administered 2014-10-02 (×2): 100 ug via INTRAVENOUS

## 2014-10-02 MED ORDER — CEFAZOLIN SODIUM 1-5 GM-% IV SOLN
INTRAVENOUS | Status: AC
  Filled 2014-10-02: qty 50

## 2014-10-02 MED ORDER — MEPERIDINE HCL 25 MG/ML IJ SOLN
12.5000 mg | Freq: Once | INTRAMUSCULAR | Status: AC
  Administered 2014-10-02: 12.5 mg via INTRAVENOUS

## 2014-10-02 MED ORDER — SODIUM CHLORIDE 0.9 % IV SOLN
INTRAVENOUS | Status: DC | PRN
  Administered 2014-10-02 (×3): via INTRAVENOUS

## 2014-10-02 MED ORDER — PROPOFOL 10 MG/ML IV BOLUS
INTRAVENOUS | Status: DC | PRN
  Administered 2014-10-02: 160 mg via INTRAVENOUS

## 2014-10-02 MED ORDER — TOBRAMYCIN SULFATE 1.2 G IJ SOLR
INTRAMUSCULAR | Status: DC | PRN
  Administered 2014-10-02: 1.2 g

## 2014-10-02 MED ORDER — ARTIFICIAL TEARS OP OINT
TOPICAL_OINTMENT | OPHTHALMIC | Status: AC
Start: 2014-10-02 — End: 2014-10-02
  Filled 2014-10-02: qty 3.5

## 2014-10-02 MED ORDER — VANCOMYCIN HCL 1000 MG IV SOLR
INTRAVENOUS | Status: DC | PRN
  Administered 2014-10-02: 1000 mg

## 2014-10-02 MED ORDER — GLYCOPYRROLATE 0.2 MG/ML IJ SOLN
INTRAMUSCULAR | Status: AC
  Filled 2014-10-02: qty 3

## 2014-10-02 MED ORDER — ROCURONIUM BROMIDE 50 MG/5ML IV SOLN
INTRAVENOUS | Status: AC
  Filled 2014-10-02: qty 1

## 2014-10-02 MED ORDER — CALCIUM CHLORIDE 10 % IV SOLN
INTRAVENOUS | Status: AC
  Filled 2014-10-02: qty 10

## 2014-10-02 MED ORDER — VANCOMYCIN HCL 1000 MG IV SOLR
INTRAVENOUS | Status: AC
  Filled 2014-10-02: qty 1000

## 2014-10-02 MED ORDER — SODIUM CHLORIDE 0.9 % IV SOLN: Freq: Once | INTRAVENOUS | Status: DC

## 2014-10-02 MED ORDER — ONDANSETRON HCL 4 MG/2ML IJ SOLN: 4.0000 mg | Freq: Four times a day (QID) | INTRAMUSCULAR | Status: DC | PRN

## 2014-10-02 MED ORDER — VECURONIUM BROMIDE 10 MG IV SOLR
INTRAVENOUS | Status: DC | PRN
  Administered 2014-10-02 (×8): 2 mg via INTRAVENOUS

## 2014-10-02 MED ORDER — NEOSTIGMINE METHYLSULFATE 10 MG/10ML IV SOLN
INTRAVENOUS | Status: AC
  Filled 2014-10-02: qty 2

## 2014-10-02 MED ORDER — LABETALOL HCL 5 MG/ML IV SOLN
INTRAVENOUS | Status: AC
  Administered 2014-10-02: 5 mg via INTRAVENOUS
  Filled 2014-10-02: qty 4

## 2014-10-02 MED ORDER — HYDROMORPHONE HCL 1 MG/ML IJ SOLN
0.2500 mg | INTRAMUSCULAR | Status: DC | PRN
  Administered 2014-10-03 (×4): 0.5 mg via INTRAVENOUS

## 2014-10-02 MED ORDER — ALBUMIN HUMAN 5 % IV SOLN
INTRAVENOUS | Status: DC | PRN
  Administered 2014-10-02: 16:00:00 via INTRAVENOUS

## 2014-10-02 MED ORDER — LABETALOL HCL 5 MG/ML IV SOLN
5.0000 mg | INTRAVENOUS | Status: DC | PRN
  Administered 2014-10-02: 5 mg via INTRAVENOUS

## 2014-10-02 MED ORDER — ONDANSETRON HCL 4 MG/2ML IJ SOLN
INTRAMUSCULAR | Status: DC | PRN
  Administered 2014-10-02: 4 mg via INTRAVENOUS

## 2014-10-02 MED ORDER — CALCIUM CHLORIDE 10 % IV SOLN
INTRAVENOUS | Status: DC | PRN
  Administered 2014-10-02: 1 g via INTRAVENOUS

## 2014-10-02 MED ORDER — OXYCODONE HCL 5 MG PO TABS: 5.0000 mg | ORAL_TABLET | Freq: Once | ORAL | Status: AC | PRN

## 2014-10-02 MED ORDER — NEOSTIGMINE METHYLSULFATE 10 MG/10ML IV SOLN
INTRAVENOUS | Status: DC | PRN
  Administered 2014-10-02: 5 mg via INTRAVENOUS

## 2014-10-02 MED ORDER — MEPERIDINE HCL 25 MG/ML IJ SOLN
INTRAMUSCULAR | Status: AC
  Administered 2014-10-02: 12.5 mg via INTRAVENOUS
  Filled 2014-10-02: qty 1

## 2014-10-02 MED ORDER — PROPOFOL 10 MG/ML IV BOLUS
INTRAVENOUS | Status: AC
  Filled 2014-10-02: qty 20

## 2014-10-02 SURGICAL SUPPLY — 125 items
APPLIER CLIP 11 MED OPEN (CLIP)
APR CLP MED 11 20 MLT OPN (CLIP)
BANDAGE ESMARK 6X9 LF (GAUZE/BANDAGES/DRESSINGS) IMPLANT
BIT DRILL AO MATTA 2.5MX230M (BIT) IMPLANT
BIT DRILL CALIBRATED 3.2MM (DRILL) IMPLANT
BIT DRILL GUIDEWIRE 2.5X200 (WIRE) ×14 IMPLANT
BIT DRILL PERCUTANEOUS 4.3 (BIT) ×2
BIT DRILL PERCUTANEOUS 4.3MM (BIT) IMPLANT
BIT DRILL TWST MATTA 3.5MX195M (BIT) IMPLANT
BLADE SURG 10 STRL SS (BLADE) ×4 IMPLANT
BLADE SURG ROTATE 9660 (MISCELLANEOUS) IMPLANT
BNDG CMPR 9X6 STRL LF SNTH (GAUZE/BANDAGES/DRESSINGS) ×2
BNDG COHESIVE 4X5 TAN STRL (GAUZE/BANDAGES/DRESSINGS) ×4 IMPLANT
BNDG COHESIVE 6X5 TAN STRL LF (GAUZE/BANDAGES/DRESSINGS) ×2 IMPLANT
BNDG ESMARK 6X9 LF (GAUZE/BANDAGES/DRESSINGS) ×4
BNDG GAUZE ELAST 4 BULKY (GAUZE/BANDAGES/DRESSINGS) ×8 IMPLANT
BNDG GAUZE STRTCH 6 (GAUZE/BANDAGES/DRESSINGS) ×12 IMPLANT
BONE CEMENT PALACOSE (Orthopedic Implant) ×4 IMPLANT
BRUSH SCRUB DISP (MISCELLANEOUS) ×12 IMPLANT
CEMENT BONE PALACOSE (Orthopedic Implant) IMPLANT
CLIP APPLIE 11 MED OPEN (CLIP) IMPLANT
CLOSURE WOUND 1/2 X4 (GAUZE/BANDAGES/DRESSINGS) ×1
CONT SPEC 4OZ CLIKSEAL STRL BL (MISCELLANEOUS) ×2 IMPLANT
COVER SURGICAL LIGHT HANDLE (MISCELLANEOUS) ×8 IMPLANT
DRAIN CHANNEL 10F 3/8 F FF (DRAIN) IMPLANT
DRAIN CHANNEL 15F RND FF W/TCR (WOUND CARE) IMPLANT
DRAPE C-ARM 42X72 X-RAY (DRAPES) ×4 IMPLANT
DRAPE C-ARMOR (DRAPES) ×4 IMPLANT
DRAPE EXTREMITY T 121X128X90 (DRAPE) ×2 IMPLANT
DRAPE IMP U-DRAPE 54X76 (DRAPES) ×4 IMPLANT
DRAPE INCISE IOBAN 66X45 STRL (DRAPES) ×4 IMPLANT
DRAPE INCISE IOBAN 85X60 (DRAPES) ×8 IMPLANT
DRAPE ORTHO SPLIT 77X108 STRL (DRAPES) ×16
DRAPE PROXIMA HALF (DRAPES) ×2 IMPLANT
DRAPE SURG ORHT 6 SPLT 77X108 (DRAPES) ×4 IMPLANT
DRAPE U-SHAPE 47X51 STRL (DRAPES) ×4 IMPLANT
DRILL BIT AO MATTA 2.5MX230M (BIT) ×4
DRILL BIT PERCUTANEOUS 4.3MM (BIT) ×4
DRILL CALIBRATED 3.2MM (DRILL) ×4
DRILL TWIST AO MATTA 3.5MX195M (BIT) ×4
DRSG ADAPTIC 3X8 NADH LF (GAUZE/BANDAGES/DRESSINGS) ×4 IMPLANT
DRSG MEPILEX BORDER 4X12 (GAUZE/BANDAGES/DRESSINGS) IMPLANT
DRSG MEPILEX BORDER 4X4 (GAUZE/BANDAGES/DRESSINGS) ×4 IMPLANT
DRSG MEPILEX BORDER 4X8 (GAUZE/BANDAGES/DRESSINGS) ×6 IMPLANT
ELECT BLADE 6.5 EXT (BLADE) IMPLANT
ELECT CAUTERY BLADE 6.4 (BLADE) ×2 IMPLANT
ELECT REM PT RETURN 9FT ADLT (ELECTROSURGICAL) ×4
ELECTRODE REM PT RTRN 9FT ADLT (ELECTROSURGICAL) ×2 IMPLANT
EVACUATOR 1/8 PVC DRAIN (DRAIN) IMPLANT
EVACUATOR SILICONE 100CC (DRAIN) ×4 IMPLANT
GAUZE SPONGE 4X4 12PLY STRL (GAUZE/BANDAGES/DRESSINGS) ×4 IMPLANT
GAUZE SPONGE 4X4 16PLY XRAY LF (GAUZE/BANDAGES/DRESSINGS) ×4 IMPLANT
GLOVE BIO SURGEON STRL SZ7.5 (GLOVE) ×8 IMPLANT
GLOVE BIO SURGEON STRL SZ8 (GLOVE) ×8 IMPLANT
GLOVE BIOGEL PI IND STRL 7.5 (GLOVE) ×2 IMPLANT
GLOVE BIOGEL PI IND STRL 8 (GLOVE) ×2 IMPLANT
GLOVE BIOGEL PI INDICATOR 7.5 (GLOVE) ×2
GLOVE BIOGEL PI INDICATOR 8 (GLOVE) ×6
GOWN STRL REUS W/ TWL LRG LVL3 (GOWN DISPOSABLE) ×4 IMPLANT
GOWN STRL REUS W/ TWL XL LVL3 (GOWN DISPOSABLE) ×2 IMPLANT
GOWN STRL REUS W/TWL 2XL LVL3 (GOWN DISPOSABLE) ×4 IMPLANT
GOWN STRL REUS W/TWL LRG LVL3 (GOWN DISPOSABLE) ×12
GOWN STRL REUS W/TWL XL LVL3 (GOWN DISPOSABLE) ×4
HANDPIECE INTERPULSE COAX TIP (DISPOSABLE) ×4
KIT BASIN OR (CUSTOM PROCEDURE TRAY) ×4 IMPLANT
KIT ROOM TURNOVER OR (KITS) ×4 IMPLANT
LIGHT ORTHO (MISCELLANEOUS) ×4 IMPLANT
LOOP VESSEL MAXI BLUE (MISCELLANEOUS) IMPLANT
MANIFOLD NEPTUNE II (INSTRUMENTS) ×6 IMPLANT
NDL MAYO TROCAR (NEEDLE) ×2 IMPLANT
NEEDLE MAYO TROCAR (NEEDLE) ×4 IMPLANT
NS IRRIG 1000ML POUR BTL (IV SOLUTION) ×4 IMPLANT
PACK GENERAL/GYN (CUSTOM PROCEDURE TRAY) ×2 IMPLANT
PACK ORTHO EXTREMITY (CUSTOM PROCEDURE TRAY) ×2 IMPLANT
PACK TOTAL JOINT (CUSTOM PROCEDURE TRAY) ×6 IMPLANT
PACK UNIVERSAL I (CUSTOM PROCEDURE TRAY) ×4 IMPLANT
PAD ARMBOARD 7.5X6 YLW CONV (MISCELLANEOUS) ×10 IMPLANT
PADDING CAST COTTON 6X4 STRL (CAST SUPPLIES) ×4 IMPLANT
PENCIL BUTTON HOLSTER BLD 10FT (ELECTRODE) ×2 IMPLANT
PIN APEX 6X180MM EXFIX (EXFIX) ×2 IMPLANT
PLATE ACET STRT 94.5M 8H (Plate) ×2 IMPLANT
PLATE VA-LCPCONDYLAR 4.5MM (Plate) ×2 IMPLANT
RETRIEVER SUT HEWSON (MISCELLANEOUS) IMPLANT
SCREW CANN LOCKING VA 5.0X75MM (Screw) ×4 IMPLANT
SCREW CANN LOCKING VA 5.0X80MM (Screw) ×4 IMPLANT
SCREW CORTEX ST 4.5X32 (Screw) ×2 IMPLANT
SCREW CORTEX ST 4.5X36 (Screw) ×2 IMPLANT
SCREW CORTEX ST MATTA 3.5X20 (Screw) ×2 IMPLANT
SCREW CORTEX ST MATTA 3.5X24 (Screw) ×2 IMPLANT
SCREW CORTEX ST MATTA 3.5X28MM (Screw) ×4 IMPLANT
SCREW CORTEX ST MATTA 3.5X32MM (Screw) ×2 IMPLANT
SCREW CORTEX ST MATTA 3.5X34MM (Screw) ×4 IMPLANT
SCREW CORTEX ST MATTA 3.5X38M (Screw) ×4 IMPLANT
SCREW LOCK VA 5.0X34 (Screw) ×4 IMPLANT
SCREW VA CANN LOCKING 5.0X70MM (Screw) ×2 IMPLANT
SCREW VA CANNUL LOCK 5.0X60MM (Screw) ×2 IMPLANT
SET HNDPC FAN SPRY TIP SCT (DISPOSABLE) IMPLANT
SPONGE LAP 18X18 X RAY DECT (DISPOSABLE) ×14 IMPLANT
STAPLER VISISTAT 35W (STAPLE) ×4 IMPLANT
STOCKINETTE IMPERVIOUS 9X36 MD (GAUZE/BANDAGES/DRESSINGS) ×4 IMPLANT
STOCKINETTE IMPERVIOUS LG (DRAPES) ×4 IMPLANT
STRIP CLOSURE SKIN 1/2X4 (GAUZE/BANDAGES/DRESSINGS) ×3 IMPLANT
SUCTION FRAZIER TIP 10 FR DISP (SUCTIONS) ×4 IMPLANT
SUT ETHILON 3 0 PS 1 (SUTURE) ×8 IMPLANT
SUT FIBERWIRE #2 38 T-5 BLUE (SUTURE)
SUT PDS AB 1 CT  36 (SUTURE) ×2
SUT PDS AB 1 CT 36 (SUTURE) IMPLANT
SUT PDS AB 2-0 CT1 27 (SUTURE) ×2 IMPLANT
SUT PROLENE 2 TP 1 (SUTURE) ×2 IMPLANT
SUT VIC AB 0 CT1 27 (SUTURE) ×4
SUT VIC AB 0 CT1 27XBRD ANBCTR (SUTURE) ×2 IMPLANT
SUT VIC AB 1 CT1 18XCR BRD 8 (SUTURE) ×2 IMPLANT
SUT VIC AB 1 CT1 8-18 (SUTURE) ×4
SUT VIC AB 2-0 CT1 27 (SUTURE) ×4
SUT VIC AB 2-0 CT1 TAPERPNT 27 (SUTURE) ×2 IMPLANT
SUTURE FIBERWR #2 38 T-5 BLUE (SUTURE) IMPLANT
TOWEL OR 17X24 6PK STRL BLUE (TOWEL DISPOSABLE) ×4 IMPLANT
TOWEL OR 17X26 10 PK STRL BLUE (TOWEL DISPOSABLE) ×14 IMPLANT
TRAY FOLEY CATH 16FRSI W/METER (SET/KITS/TRAYS/PACK) IMPLANT
TUBE ANAEROBIC SPECIMEN COL (MISCELLANEOUS) ×2 IMPLANT
TUBE CONNECTING 12'X1/4 (SUCTIONS) ×2
TUBE CONNECTING 12X1/4 (SUCTIONS) ×4 IMPLANT
UNDERPAD 30X30 INCONTINENT (UNDERPADS AND DIAPERS) ×4 IMPLANT
WATER STERILE IRR 1000ML POUR (IV SOLUTION) ×2 IMPLANT
YANKAUER SUCT BULB TIP NO VENT (SUCTIONS) ×8 IMPLANT

## 2014-10-02 NOTE — Anesthesia Preprocedure Evaluation (Signed)
Anesthesia Evaluation  Patient identified by MRN, date of birth, ID band Patient awake    Reviewed: Allergy & Precautions, NPO status , Patient's Chart, lab work & pertinent test results  Airway Mallampati: II   Neck ROM: full    Dental   Pulmonary Current Smoker,    breath sounds clear to auscultation       Cardiovascular negative cardio ROS   Rhythm:regular Rate:Normal     Neuro/Psych    GI/Hepatic   Endo/Other    Renal/GU      Musculoskeletal   Abdominal   Peds  Hematology   Anesthesia Other Findings   Reproductive/Obstetrics                            Anesthesia Physical Anesthesia Plan  ASA: II  Anesthesia Plan: General   Post-op Pain Management:    Induction: Intravenous  Airway Management Planned: Oral ETT  Additional Equipment:   Intra-op Plan:   Post-operative Plan: Extubation in OR  Informed Consent: I have reviewed the patients History and Physical, chart, labs and discussed the procedure including the risks, benefits and alternatives for the proposed anesthesia with the patient or authorized representative who has indicated his/her understanding and acceptance.     Plan Discussed with: CRNA, Anesthesiologist and Surgeon  Anesthesia Plan Comments:         Anesthesia Quick Evaluation  
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
## 2014-10-02 NOTE — Progress Notes (Signed)
Scanned new Dilaudid PCA syringe twice, [facility] confirmed with Myrle Shengonnie Dupont RN.

## 2014-10-02 NOTE — Anesthesia Procedure Notes (Addendum)
Procedure Name: Intubation Date/Time: 10/02/2014 3:55 PM Performed by: Roney MansSMITH, Sharvil Hoey P Pre-anesthesia Checklist: Patient identified, Timeout performed, Emergency Drugs available, Suction available and Patient being monitored Patient Re-evaluated:Patient Re-evaluated prior to inductionOxygen Delivery Method: Circle system utilized Preoxygenation: Pre-oxygenation with 100% oxygen Intubation Type: IV induction Ventilation: Mask ventilation without difficulty Laryngoscope Size: Mac and 4 Grade View: Grade I Tube type: Oral Tube size: 7.5 mm Number of attempts: 1 Airway Equipment and Method: Stylet Placement Confirmation: ETT inserted through vocal cords under direct vision,  breath sounds checked- equal and bilateral and positive ETCO2 Secured at: 22 cm Tube secured with: Tape Dental Injury: Teeth and Oropharynx as per pre-operative assessment

## 2014-10-02 NOTE — Telephone Encounter (Signed)
error 

## 2014-10-02 NOTE — Telephone Encounter (Signed)
Called and spoke with Berniece Paponnie Dupont, RN patient in surgery now, asked that either she or case manager arrange [facility] for [facility] Monday to have him here by 1`130 am for port and treatment to hip, she gave verbal understanding and if patient still in their unit that will be done, but I will call Monday around 8am to verify 3:33 PM
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
## 2014-10-02 NOTE — Brief Op Note (Signed)
09/30/2014 - 10/02/2014  11:21 PM  PATIENT:  [name] [name]  49 y.o. male  PRE-OPERATIVE DIAGNOSIS:   1. OPEN LEFT DISTAL FEMUR FRACTURE WITH INTERCONDYLAR EXTENSION 2. LEFT TRANSVERSE POSTERIOR WALL ACETABULAR FRACTURE  POST-OPERATIVE DIAGNOSIS: 1. OPEN LEFT DISTAL FEMUR FRACTURE WITH INTERCONDYLAR EXTENSION 2. LEFT TRANSVERSE POSTERIOR WALL ACETABULAR FRACTURE  PROCEDURE:  Procedure(s): 1. OPEN REDUCTION INTERNAL FIXATION (ORIF) LEFT ACETABULAR FRACTURE (Left), TRANSVERSE AND POSTERIOR WALL 2. ORIF OF LEFT SUPRACONDYLAR FEMUR FRACTURE WITH INTERCONDYLAR EXTENSION 3. IRRIGATION AND DEBRIDEMENT LEFT FEMUR FRACTURE (Left) WITH REMOVAL OF BONE 4. REMOVAL OF EXTERNAL FIXATOR UNDER ANESTHESIA 5. PLACEMENT OF ANTIBIOTIC BEADS  SURGEON:  Surgeon(s) and Role:    * Myrene GalasMichael Arienna Benegas, MD - Primary  PHYSICIAN ASSISTANT: Montez MoritaKeith Paul, PA-C  ANESTHESIA:   general  I/O:  Total I/O In: 3135 [I.V.:2800; Blood:335] Out: 2875 [Urine:2375; Blood:500]  SPECIMEN:  Source of Specimen:  Anaerobic, aerobic, bone fragment left femur fracture site  DISPOSITION OF SPECIMEN:  To micro  TOURNIQUET:  * No tourniquets in log *  DICTATION: .Reubin Milanragon [phone]

## 2014-10-02 NOTE — Transfer of Care (Signed)
Immediate Anesthesia Transfer of Care Note  Patient: Trixie Rudeichard Lindholm  Procedure(s) Performed: Procedure(s): IRRIGATION AND DEBRIDEMENT LEFT FEMUR FRACTURE (Left) OPEN REDUCTION INTERNAL FIXATION (ORIF) LEFT ACETABULAR FRACTURE (Left)  Patient Location: PACU  Anesthesia Type:General  Level of Consciousness: awake, alert  and oriented  Airway & Oxygen Therapy: Patient connected to nasal cannula oxygen  Post-op Assessment: Report given to RN, Post -op Vital signs reviewed and stable and Patient moving all extremities X 4  Post vital signs: Reviewed and stable  Last Vitals:  Filed Vitals:   10/02/14 1400  BP: 114/85  Pulse: 106  Temp:   Resp: 11    Complications: No apparent anesthesia complications

## 2014-10-03 ENCOUNTER — Inpatient Hospital Stay (HOSPITAL_COMMUNITY): Payer: 59

## 2014-10-03 LAB — CBC
HCT: 29.6 % — ABNORMAL LOW (ref 39.0–52.0)
Hemoglobin: 10.3 g/dL — ABNORMAL LOW (ref 13.0–17.0)
MCH: 31.8 pg (ref 26.0–34.0)
MCHC: 34.8 g/dL (ref 30.0–36.0)
MCV: 91.4 fL (ref 78.0–100.0)
PLATELETS: 144 10*3/uL — AB (ref 150–400)
RBC: 3.24 MIL/uL — AB (ref 4.22–5.81)
RDW: 15 % (ref 11.5–15.5)
WBC: 11.2 10*3/uL — ABNORMAL HIGH (ref 4.0–10.5)

## 2014-10-03 LAB — BASIC METABOLIC PANEL
Anion gap: 6 (ref 5–15)
BUN: <5 mg/dL — ABNORMAL LOW (ref 6–20)
CO2: 26 mmol/L (ref 22–32)
CREATININE: 0.65 mg/dL (ref 0.61–1.24)
Calcium: 8.1 mg/dL — ABNORMAL LOW (ref 8.9–10.3)
Chloride: 100 mmol/L — ABNORMAL LOW (ref 101–111)
GFR calc Af Amer: >60 mL/min (ref >60)
Glucose, Bld: 145 mg/dL — ABNORMAL HIGH (ref 70–99)
Potassium: 4.3 mmol/L (ref 3.5–5.1)
Sodium: 132 mmol/L — ABNORMAL LOW (ref 135–145)

## 2014-10-03 LAB — HEMOGLOBIN AND HEMATOCRIT, BLOOD
HCT: 29.2 % — ABNORMAL LOW (ref 39.0–52.0)
Hemoglobin: 10.3 g/dL — ABNORMAL LOW (ref 13.0–17.0)

## 2014-10-03 LAB — BLOOD GAS, ARTERIAL
Acid-base deficit: 0.5 mmol/L (ref 0.0–2.0)
Bicarbonate: 23.8 mEq/L (ref 20.0–24.0)
O2 CONTENT: 2 L/min
O2 Saturation: 98.8 %
PO2 ART: 115 mmHg — AB (ref 80.0–100.0)
Patient temperature: 98.6
TCO2: 25.1 mmol/L (ref 0–100)
pCO2 arterial: 40.5 mmHg (ref 35.0–45.0)
pH, Arterial: 7.387 (ref 7.350–7.450)

## 2014-10-03 LAB — POCT I-STAT 7, (LYTES, BLD GAS, ICA,H+H)
BICARBONATE: 25.3 meq/L — AB (ref 20.0–24.0)
Calcium, Ion: 1.24 mmol/L — ABNORMAL HIGH (ref 1.12–1.23)
HCT: 27 % — ABNORMAL LOW (ref 39.0–52.0)
Hemoglobin: 9.2 g/dL — ABNORMAL LOW (ref 13.0–17.0)
O2 Saturation: 100 %
POTASSIUM: 4.1 mmol/L (ref 3.5–5.1)
Patient temperature: 36.9
SODIUM: 135 mmol/L (ref 135–145)
TCO2: 27 mmol/L (ref 0–100)
pCO2 arterial: 41.7 mmHg (ref 35.0–45.0)
pH, Arterial: 7.39 (ref 7.350–7.450)
pO2, Arterial: 201 mmHg — ABNORMAL HIGH (ref 80.0–100.0)

## 2014-10-03 LAB — POCT I-STAT 4, (NA,K, GLUC, HGB,HCT)
Glucose, Bld: 111 mg/dL — ABNORMAL HIGH (ref 70–99)
Glucose, Bld: 142 mg/dL — ABNORMAL HIGH (ref 70–99)
HCT: 21 % — ABNORMAL LOW (ref 39.0–52.0)
HCT: 27 % — ABNORMAL LOW (ref 39.0–52.0)
HEMOGLOBIN: 7.1 g/dL — AB (ref 13.0–17.0)
Hemoglobin: 9.2 g/dL — ABNORMAL LOW (ref 13.0–17.0)
POTASSIUM: 4.1 mmol/L (ref 3.5–5.1)
Potassium: 4 mmol/L (ref 3.5–5.1)
Sodium: 134 mmol/L — ABNORMAL LOW (ref 135–145)
Sodium: 134 mmol/L — ABNORMAL LOW (ref 135–145)

## 2014-10-03 LAB — VITAMIN D 25 HYDROXY (VIT D DEFICIENCY, FRACTURES): Vit D, 25-Hydroxy: 14.8 ng/mL — ABNORMAL LOW (ref 30.0–100.0)

## 2014-10-03 LAB — BLOOD PRODUCT ORDER (VERBAL) VERIFICATION

## 2014-10-03 MED ORDER — LABETALOL HCL 5 MG/ML IV SOLN
INTRAVENOUS | Status: AC
  Filled 2014-10-03: qty 4

## 2014-10-03 MED ORDER — HYDROMORPHONE 0.3 MG/ML IV SOLN
INTRAVENOUS | Status: AC
  Filled 2014-10-03: qty 25

## 2014-10-03 MED ORDER — CEFAZOLIN SODIUM-DEXTROSE 2-3 GM-% IV SOLR
2.0000 g | Freq: Three times a day (TID) | INTRAVENOUS | Status: AC
  Administered 2014-10-03 (×3): 2 g via INTRAVENOUS
  Filled 2014-10-03 (×4): qty 50

## 2014-10-03 MED ORDER — KCL IN DEXTROSE-NACL 20-5-0.45 MEQ/L-%-% IV SOLN
INTRAVENOUS | Status: AC
  Filled 2014-10-03: qty 1000

## 2014-10-03 MED ORDER — HYDROMORPHONE HCL 1 MG/ML IJ SOLN
INTRAMUSCULAR | Status: AC
  Filled 2014-10-03: qty 1
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
## 2014-10-03 NOTE — Progress Notes (Signed)
2.5mg  hydromorphone wasted in sharps box, witnessed by Graybar ElectricJosh RN, replaced PCA cartridge.

## 2014-10-03 NOTE — Progress Notes (Signed)
Orthopaedic Trauma Service (OTS)  POD 1 s/p ORIF left acetabulum, L intercondylar distal femur fracure  Subjective: C/o pain  Objective:  Physical Exam: ; managed a few degrees of knee flexion and hip motion but patient had significant pain with that LLE all dressings pristine and dry  Sens DPN decreased per baseline, SPN, TN intact  Motor EHL absent per baseline, ankle ext, flex, and lesser toe flex/ext intact  DP 2+; min edema   Imaging: knee and acetab x-rays look excellent; no complications noted, no intraarticular debris visible; knee alignment 4 degrees valgus anatomic axis  Assessment/Plan: PT/OT eval and treat with aggressive mobilization, beginning bed to chair today and knee and hip motion. Severe cartilage injury noted in both knee and hip increase risks of early post traumatic DJD and need for joint replacement after healing, but high revolution low impact strengthening of the surrounding muscles offers the best chance for improvement in pain and function short and long term.  Myrene GalasMichael Maytte Jacot, MD [facility] [phone] (p)

## 2014-10-03 NOTE — Progress Notes (Signed)
Late entry  D; Call MD regarding result of ABGA,  new order received check stat H&H, if hgb>7, transfusion 2 unit PRBC.     sent H&H, report given to unit RN.     Check H7H result, Hgb is 10.3.
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
## 2014-10-04 ENCOUNTER — Inpatient Hospital Stay (HOSPITAL_COMMUNITY): Payer: 59

## 2014-10-04 LAB — TYPE AND SCREEN
ABO/RH(D): A POS
ANTIBODY SCREEN: NEGATIVE
UNIT DIVISION: 0
UNIT DIVISION: 0
UNIT DIVISION: 0
Unit division: 0
Unit division: 0
Unit division: 0

## 2014-10-04 LAB — BASIC METABOLIC PANEL
Anion gap: 8 (ref 5–15)
BUN: <5 mg/dL — ABNORMAL LOW (ref 6–20)
CALCIUM: 8.1 mg/dL — AB (ref 8.9–10.3)
CHLORIDE: 97 mmol/L — AB (ref 101–111)
CO2: 27 mmol/L (ref 22–32)
Creatinine, Ser: 0.7 mg/dL (ref 0.61–1.24)
GFR calc Af Amer: >60 mL/min (ref >60)
GFR calc non Af Amer: >60 mL/min (ref >60)
GLUCOSE: 124 mg/dL — AB (ref 70–99)
POTASSIUM: 3.7 mmol/L (ref 3.5–5.1)
SODIUM: 132 mmol/L — AB (ref 135–145)

## 2014-10-04 LAB — URINE MICROSCOPIC-ADD ON

## 2014-10-04 LAB — URINALYSIS, ROUTINE W REFLEX MICROSCOPIC
BILIRUBIN URINE: NEGATIVE
GLUCOSE, UA: NEGATIVE mg/dL
Ketones, ur: NEGATIVE mg/dL
Leukocytes, UA: NEGATIVE
Nitrite: NEGATIVE
PH: 5.5 (ref 5.0–8.0)
Protein, ur: 30 mg/dL — AB
SPECIFIC GRAVITY, URINE: 1.024 (ref 1.005–1.030)
Urobilinogen, UA: 0.2 mg/dL (ref 0.0–1.0)

## 2014-10-04 LAB — CBC
HEMATOCRIT: 28.2 % — AB (ref 39.0–52.0)
HEMOGLOBIN: 9.8 g/dL — AB (ref 13.0–17.0)
MCH: 31.5 pg (ref 26.0–34.0)
MCHC: 34.8 g/dL (ref 30.0–36.0)
MCV: 90.7 fL (ref 78.0–100.0)
Platelets: 181 10*3/uL (ref 150–400)
RBC: 3.11 MIL/uL — ABNORMAL LOW (ref 4.22–5.81)
RDW: 14.5 % (ref 11.5–15.5)
WBC: 13.2 10*3/uL — ABNORMAL HIGH (ref 4.0–10.5)

## 2014-10-04 MED ORDER — ACETAMINOPHEN 325 MG PO TABS
650.0000 mg | ORAL_TABLET | Freq: Four times a day (QID) | ORAL | Status: DC | PRN
  Administered 2014-10-04 (×3): 650 mg via ORAL
  Filled 2014-10-04 (×3): qty 2

## 2014-10-04 NOTE — Anesthesia Postprocedure Evaluation (Signed)
  Anesthesia Post-op Note  Patient: [name] [name]  Procedure(s) Performed: Procedure(s) (LRB): IRRIGATION AND DEBRIDEMENT LEFT FEMUR FRACTURE (Left) OPEN REDUCTION INTERNAL FIXATION (ORIF) LEFT ACETABULAR FRACTURE (Left)  Patient Location: PACU  Anesthesia Type: General  Level of Consciousness: awake and alert   Airway and Oxygen Therapy: Patient Spontanous Breathing  Post-op Pain: mild  Post-op Assessment: Post-op Vital signs reviewed, Patient's Cardiovascular Status Stable, Respiratory Function Stable, Patent Airway and No signs of Nausea or vomiting  Last Vitals:  Filed Vitals:   10/04/14 0600  BP: 107/69  Pulse: 119  Temp: 37.8 C  Resp: 13    Post-op Vital Signs: stable   Complications: No apparent anesthesia complications
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
## 2014-10-04 NOTE — Progress Notes (Signed)
2 Days Post-Op  Subjective: No real complaints, tol clears, foley in place  Objective: Vital signs in last 24 hours: Temp:  [99.7 F (37.6 C)-102.6 F (39.2 C)] 101.3 F (38.5 C) (05/08 0900) Pulse Rate:  [107-142] 124 (05/08 0900) Resp:  [10-24] 15 (05/08 0900) BP: (97-124)/(51-74) 108/69 mmHg (05/08 0900) SpO2:  [97 %-100 %] 99 % (05/08 0900) Arterial Line BP: (134-164)/(61-75) 164/75 mmHg (05/07 1500) Last BM Date:  (PTA)  Intake/Output from previous day: 05/07 0701 - 05/08 0700 In: 4266.3 [P.O.:640; I.V.:1436.3; IV Piggyback:290] Out: 3725 [Urine:3725] Intake/Output this shift: Total I/O In: 600 [P.O.:150; I.V.:100; Other:350] Out: -   General appearance: no distress Resp: diminished breath sounds bibasilar Cardio: tachy rr GI: soft nt  Lab Results:   Recent Labs  10/03/14 0020 10/04/14 0230  WBC 11.2* 13.2*  HGB 10.3*  10.3* 9.8*  HCT 29.6*  29.2* 28.2*  PLT 144* 181   BMET  Recent Labs  10/03/14 0450 10/04/14 0230  NA 132* 132*  K 4.3 3.7  CL 100* 97*  CO2 26 27  GLUCOSE 145* 124*  BUN <5* <5*  CREATININE 0.65 0.70  CALCIUM 8.1* 8.1*   PT/INR  Recent Labs  10/02/14 0220  LABPROT 14.4  INR 1.10   ABG  Recent Labs  10/02/14 2154 10/03/14  PHART 7.390 7.387  HCO3 25.3* 23.8    Studies/Results: Dg Pelvis Comp Min 3v  10/03/2014   CLINICAL DATA:  Status post left acetabular fracture repair.  EXAM: JUDET PELVIS - 3+ VIEW  COMPARISON:  10/01/2014  FINDINGS: There has been interval ORIF of the previously described comminuted left acetabulum fracture with 2 malleable plates and screws. Alignment appears anatomic. The hips are located. A fixation plate and screws are partially visualized in the left femoral shaft. Pin tracts are noted in the proximal left femur related to prior external fixator. Postoperative soft tissue gas is noted lateral to the proximal left femur.  IMPRESSION: Interval ORIF of left acetabulum fracture in anatomic  alignment.   Electronically Signed   By: Sebastian Ache   On: 10/03/2014 00:44   Dg Pelvis Comp Min 3v  10/02/2014   CLINICAL DATA:  ORIF left distal femur and left acetabulum.  EXAM: JUDET PELVIS - 3+ VIEW  COMPARISON:  10/01/2014  FINDINGS: Four intraoperative spot fluoroscopic images of the left hip are provided. These demonstrate interval ORIF of the previously described comminuted left acetabular fracture with malleable plates and screws. The left hip is located, and fracture fragment alignment appears improved on these limited fluoroscopic images.  IMPRESSION: Intraoperative images during ORIF of left acetabular fracture.   Electronically Signed   By: Sebastian Ache   On: 10/02/2014 23:37   Dg Chest Port 1 View  10/04/2014   CLINICAL DATA:  Tachycardia.  Shortness of breath.  EXAM: PORTABLE CHEST - 1 VIEW  COMPARISON:  None.  FINDINGS: The cardiac silhouette is near the upper limit of normal in size. Clear lungs with normal vascularity. Lower thoracic spine degenerative changes.  IMPRESSION: No acute abnormality.   Electronically Signed   By: Beckie Salts M.D.   On: 10/04/2014 08:03   Dg Knee Left Port  10/03/2014   CLINICAL DATA:  Distal left femur fracture.  EXAM: PORTABLE LEFT KNEE - 1-2 VIEW  COMPARISON:  10/01/2014  FINDINGS: There has been interval ORIF of the comminuted distal femur fracture with lateral plate and screws. Alignment is significantly improved compared to the preoperative study. Posterior displacement of the main distal  fragment has been reduced. There is an approximately 3 mm wide discontinuity along the distal femoral articular surface between the condyles. A large butterfly fragment of the distal shaft demonstrates mild residual medial displacement and angulation in relation to the main proximal fragment. The knee is located. Postoperative soft tissue gas is present about the knee along with soft tissue swelling.  IMPRESSION: Interval ORIF of comminuted distal left femur fracture with  improved fracture approximation as above.   Electronically Signed   By: Sebastian AcheAllen  Grady   On: 10/03/2014 01:02   Dg C-arm Gt 120 Min  10/02/2014   CLINICAL DATA:  ORIF left distal femur fracture.  EXAM: LEFT FEMUR 2 VIEWS; DG C-ARM GT 120 MIN  FLUOROSCOPY TIME:  C-arm fluoroscopic images were obtained intraoperatively and submitted for post operative interpretation. Please see the performing provider's procedural report for the fluoroscopy time utilized.  COMPARISON:  10/01/2014  FINDINGS: Six intraoperative spot fluoroscopic images of the distal left femur are provided. These demonstrate interval ORIF of the previously documented comminuted, intra-articular distal femur fracture with a lateral plate and screws. Fracture alignment appears greatly improved, with improved approximation of the distal articular surface and with reduction of the posterior displacement of the main distal fragment present on the preoperative study. The knee is located.  IMPRESSION: Intraoperative images during ORIF of distal left femur fracture.   Electronically Signed   By: Sebastian AcheAllen  Grady   On: 10/02/2014 23:45   Dg Femur Min 2 Views Left  10/02/2014   CLINICAL DATA:  ORIF left distal femur fracture.  EXAM: LEFT FEMUR 2 VIEWS; DG C-ARM GT 120 MIN  FLUOROSCOPY TIME:  C-arm fluoroscopic images were obtained intraoperatively and submitted for post operative interpretation. Please see the performing provider's procedural report for the fluoroscopy time utilized.  COMPARISON:  10/01/2014  FINDINGS: Six intraoperative spot fluoroscopic images of the distal left femur are provided. These demonstrate interval ORIF of the previously documented comminuted, intra-articular distal femur fracture with a lateral plate and screws. Fracture alignment appears greatly improved, with improved approximation of the distal articular surface and with reduction of the posterior displacement of the main distal fragment present on the preoperative study. The knee  is located.  IMPRESSION: Intraoperative images during ORIF of distal left femur fracture.   Electronically Signed   By: Sebastian AcheAllen  Grady   On: 10/02/2014 23:45    Anti-infectives: Anti-infectives    Start     Dose/Rate Route Frequency Ordered Stop   10/03/14 0600  ceFAZolin (ANCEF) IVPB 2 g/50 mL premix     2 g 100 mL/hr over 30 Minutes Intravenous 3 times per day 10/03/14 0007 10/03/14 2330   10/02/14 1837  tobramycin (NEBCIN) powder  Status:  Discontinued       As needed 10/02/14 1841 10/02/14 2332   10/02/14 1836  vancomycin (VANCOCIN) powder  Status:  Discontinued       As needed 10/02/14 1837 10/02/14 2332   10/01/14 0400  [MAR Hold]  ceFAZolin (ANCEF) IVPB 1 g/50 mL premix  Status:  Discontinued     (MAR Hold since 10/02/14 1658)   1 g 100 mL/hr over 30 Minutes Intravenous Every 6 hours 10/01/14 0145 10/03/14 0007   10/01/14 0145  ceFAZolin (ANCEF) IVPB 1 g/50 mL premix  Status:  Discontinued     1 g 100 mL/hr over 30 Minutes Intravenous 3 times per day 10/01/14 0145 10/01/14 0153   09/30/14 1930  ceFAZolin (ANCEF) IVPB 1 g/50 mL premix  1 g 100 mL/hr over 30 Minutes Intravenous  Once 09/30/14 1923 09/30/14 2008      Assessment/Plan: POD 4 orif pelvis  1. Neuro- continue pca, doing well with that 2. pulm toilet, cxr negative yesterday does not clinically have pna either 3. cv - tachycardia likely multifactorial, not really anemic 4. Clear liquids can advance as tolerated 5. Follow hct 6. Not sure where fevers are from right now, will send blood cultures, no obvious site on exam, will check ua, cxr yesterday was negative 7. Renal- keep foley inplace for monitoring, follow hyponatremia  Adar Rase 10/04/2014
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
## 2014-10-05 ENCOUNTER — Ambulatory Visit
Admit: 2014-10-05 | Discharge: 2014-10-05 | Disposition: A | Discharge status: Home / Self Care | Payer: 59 | Source: Ambulatory Visit | Attending: Radiation Oncology | Admitting: Radiation Oncology

## 2014-10-05 ENCOUNTER — Ambulatory Visit
Admit: 2014-10-05 | Discharge: 2014-10-05 | Disposition: A | Discharge status: Home / Self Care | Payer: 59 | Attending: Radiation Oncology | Admitting: Radiation Oncology

## 2014-10-05 ENCOUNTER — Encounter: Payer: Self-pay | Admitting: Radiation Oncology

## 2014-10-05 DIAGNOSIS — M6149 Other calcification of muscle, multiple sites: Secondary | ICD-10-CM

## 2014-10-05 DIAGNOSIS — M6100 Myositis ossificans traumatica, unspecified site: Secondary | ICD-10-CM

## 2014-10-05 LAB — BASIC METABOLIC PANEL
ANION GAP: 9 (ref 5–15)
BUN: 5 mg/dL — ABNORMAL LOW (ref 6–20)
CALCIUM: 8.2 mg/dL — AB (ref 8.9–10.3)
CO2: 28 mmol/L (ref 22–32)
CREATININE: 0.75 mg/dL (ref 0.61–1.24)
Chloride: 96 mmol/L — ABNORMAL LOW (ref 101–111)
GFR calc Af Amer: >60 mL/min (ref >60)
GFR calc non Af Amer: >60 mL/min (ref >60)
Glucose, Bld: 120 mg/dL — ABNORMAL HIGH (ref 70–99)
POTASSIUM: 4.3 mmol/L (ref 3.5–5.1)
SODIUM: 133 mmol/L — AB (ref 135–145)

## 2014-10-05 LAB — CBC
HCT: 27.4 % — ABNORMAL LOW (ref 39.0–52.0)
HEMOGLOBIN: 9.3 g/dL — AB (ref 13.0–17.0)
MCH: 31.3 pg (ref 26.0–34.0)
MCHC: 33.9 g/dL (ref 30.0–36.0)
MCV: 92.3 fL (ref 78.0–100.0)
PLATELETS: 270 10*3/uL (ref 150–400)
RBC: 2.97 MIL/uL — ABNORMAL LOW (ref 4.22–5.81)
RDW: 14 % (ref 11.5–15.5)
WBC: 12.4 10*3/uL — AB (ref 4.0–10.5)

## 2014-10-05 LAB — WOUND CULTURE
Culture: NO GROWTH
Gram Stain: NONE SEEN

## 2014-10-05 NOTE — Progress Notes (Signed)
Rehab Admissions Coordinator Note:  Patient was screened by Trish MageLogue, Jaamal Farooqui M for appropriateness for an Inpatient Acute Rehab Consult.  Noted PT recommending CIR.  At this time, we are recommending Inpatient Rehab consult.  Trish MageLogue, Deaglan Lile M 10/05/2014, 8:33 AM  I can be reached at [phone].
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
## 2014-10-05 NOTE — Progress Notes (Signed)
Trauma Service Note  Subjective: Patient is stable.  Has been febrile. But currently low grade.  Objective: Vital signs in last 24 hours: Temp:  [99.1 F (37.3 C)-102 F (38.9 C)] 99.6 F (37.6 C) (05/09 0812) Pulse Rate:  [109-147] 111 (05/09 0700) Resp:  [11-21] 12 (05/09 0848) BP: (62-128)/(28-78) 110/60 mmHg (05/09 0700) SpO2:  [93 %-100 %] 100 % (05/09 0848) FiO2 (%):  [99 %] 99 % (05/08 1953) Last BM Date:  (PTA)  Intake/Output from previous day: 05/08 0701 - 05/09 0700 In: 4525 [P.O.:150; I.V.:1250; IV Piggyback:240] Out: -  Intake/Output this shift:    General: No acute distress  Lungs: Clear to auscultation  Abd: Soft, good bowel sounds.  Extremities: No changes  Neuro: Intact  Lab Results: CBC   Recent Labs  10/04/14 0230 10/05/14 0210  WBC 13.2* 12.4*  HGB 9.8* 9.3*  HCT 28.2* 27.4*  PLT 181 270   BMET  Recent Labs  10/04/14 0230 10/05/14 0210  NA 132* 133*  K 3.7 4.3  CL 97* 96*  CO2 27 28  GLUCOSE 124* 120*  BUN <5* 5*  CREATININE 0.70 0.75  CALCIUM 8.1* 8.2*   PT/INR No results for input(s): LABPROT, INR in the last 72 hours. ABG  Recent Labs  10/02/14 2154 10/03/14  PHART 7.390 7.387  HCO3 25.3* 23.8    Studies/Results: Dg Chest Port 1 View  10/04/2014   CLINICAL DATA:  Tachycardia.  Shortness of breath.  EXAM: PORTABLE CHEST - 1 VIEW  COMPARISON:  None.  FINDINGS: The cardiac silhouette is near the upper limit of normal in size. Clear lungs with normal vascularity. Lower thoracic spine degenerative changes.  IMPRESSION: No acute abnormality.   Electronically Signed   By: Beckie SaltsSteven  Reid M.D.   On: 10/04/2014 08:03    Anti-infectives: Anti-infectives    Start     Dose/Rate Route Frequency Ordered Stop   10/03/14 0600  ceFAZolin (ANCEF) IVPB 2 g/50 mL premix     2 g 100 mL/hr over 30 Minutes Intravenous 3 times per day 10/03/14 0007 10/03/14 2330   10/02/14 1837  tobramycin (NEBCIN) powder  Status:  Discontinued       As  needed 10/02/14 1841 10/02/14 2332   10/02/14 1836  vancomycin (VANCOCIN) powder  Status:  Discontinued       As needed 10/02/14 1837 10/02/14 2332   10/01/14 0400  [MAR Hold]  ceFAZolin (ANCEF) IVPB 1 g/50 mL premix  Status:  Discontinued     (MAR Hold since 10/02/14 1658)   1 g 100 mL/hr over 30 Minutes Intravenous Every 6 hours 10/01/14 0145 10/03/14 0007   10/01/14 0145  ceFAZolin (ANCEF) IVPB 1 g/50 mL premix  Status:  Discontinued     1 g 100 mL/hr over 30 Minutes Intravenous 3 times per day 10/01/14 0145 10/01/14 0153   09/30/14 1930  ceFAZolin (ANCEF) IVPB 1 g/50 mL premix     1 g 100 mL/hr over 30 Minutes Intravenous  Once 09/30/14 1923 09/30/14 2008      Assessment/Plan: s/p Procedure(s): IRRIGATION AND DEBRIDEMENT LEFT FEMUR FRACTURE OPEN REDUCTION INTERNAL FIXATION (ORIF) LEFT ACETABULAR FRACTURE Advance diet Will attenpt to DC Foley.  Transfer to the floor.  LOS: 5 days   [name] [name], III, MD, FACS [phone] Trauma Surgeon 10/05/2014

## 2014-10-05 NOTE — Progress Notes (Signed)
UR completed.  Therapies recommending CIR at d/c. Consult completed.   Carlyle LipaMichelle Haedyn Breau, RN BSN MHA CCM Trauma/Neuro ICU Case Manager [phone]

## 2014-10-05 NOTE — Progress Notes (Signed)
I await OT eval so that I can proceed [facility] for a possible inpt rehab admission. I will follow up tomorrow. [phone]

## 2014-10-05 NOTE — Progress Notes (Signed)
[facility] Rad[facility] Therapy Treatment Record Phone [phone]   Radiation Therapy was administered to [name] [name] on: 10/05/2014  1:23 PM and was treatment # 1 out of a planned course of 1 treatments.
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
## 2014-10-05 NOTE — Progress Notes (Signed)
OT Cancellation Note  Patient Details Name: [name] [name] MRN: [id number] DOB: 04/27/1966   Cancelled Treatment:    Reason Eval/Treat Not Completed: Patient at procedure or test/ unavailable.  Will reattempt.   [name] [name], [name] [name] [name], [name] [name]  10/05/2014, 10:55 AM
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
## 2014-10-06 ENCOUNTER — Encounter (HOSPITAL_COMMUNITY): Payer: Self-pay | Admitting: Orthopedic Surgery

## 2014-10-06 DIAGNOSIS — M6150 Other ossification of muscle, unspecified site: Secondary | ICD-10-CM | POA: Insufficient documentation

## 2014-10-06 DIAGNOSIS — M6149 Other calcification of muscle, multiple sites: Secondary | ICD-10-CM | POA: Insufficient documentation

## 2014-10-06 DIAGNOSIS — D62 Acute posthemorrhagic anemia: Secondary | ICD-10-CM | POA: Diagnosis present

## 2014-10-06 DIAGNOSIS — S32402A Unspecified fracture of left acetabulum, initial encounter for closed fracture: Secondary | ICD-10-CM | POA: Diagnosis present

## 2014-10-06 DIAGNOSIS — M6100 Myositis ossificans traumatica, unspecified site: Secondary | ICD-10-CM | POA: Insufficient documentation

## 2014-10-06 LAB — COMPREHENSIVE METABOLIC PANEL
ALBUMIN: 2.2 g/dL — AB (ref 3.5–5.0)
ALK PHOS: 67 U/L (ref 38–126)
ALT: 45 U/L (ref 17–63)
ANION GAP: 11 (ref 5–15)
AST: 139 U/L — ABNORMAL HIGH (ref 15–41)
BUN: 7 mg/dL (ref 6–20)
CALCIUM: 8.4 mg/dL — AB (ref 8.9–10.3)
CO2: 27 mmol/L (ref 22–32)
Chloride: 96 mmol/L — ABNORMAL LOW (ref 101–111)
Creatinine, Ser: 0.67 mg/dL (ref 0.61–1.24)
GFR calc non Af Amer: >60 mL/min (ref >60)
Glucose, Bld: 136 mg/dL — ABNORMAL HIGH (ref 70–99)
POTASSIUM: 3.9 mmol/L (ref 3.5–5.1)
SODIUM: 134 mmol/L — AB (ref 135–145)
TOTAL PROTEIN: 6.4 g/dL — AB (ref 6.5–8.1)
Total Bilirubin: 1.1 mg/dL (ref 0.3–1.2)

## 2014-10-06 LAB — TISSUE CULTURE: Culture: NO GROWTH

## 2014-10-06 LAB — URINALYSIS, ROUTINE W REFLEX MICROSCOPIC
BILIRUBIN URINE: NEGATIVE
Glucose, UA: NEGATIVE mg/dL
HGB URINE DIPSTICK: NEGATIVE
KETONES UR: NEGATIVE mg/dL
Leukocytes, UA: NEGATIVE
NITRITE: NEGATIVE
PROTEIN: NEGATIVE mg/dL
SPECIFIC GRAVITY, URINE: 1.018 (ref 1.005–1.030)
Urobilinogen, UA: 0.2 mg/dL (ref 0.0–1.0)
pH: 6 (ref 5.0–8.0)

## 2014-10-06 LAB — PHOSPHORUS: Phosphorus: 2.9 mg/dL (ref 2.5–4.6)

## 2014-10-06 LAB — VITAMIN D 1,25 DIHYDROXY
Vitamin D 1, 25 (OH)2 Total: 24 pg/mL
Vitamin D2 1, 25 (OH)2: <10 pg/mL
Vitamin D3 1, 25 (OH)2: 24 pg/mL

## 2014-10-06 LAB — MAGNESIUM: Magnesium: 2 mg/dL (ref 1.7–2.4)

## 2014-10-06 LAB — TSH: TSH: 1.691 u[IU]/mL (ref 0.350–4.500)

## 2014-10-06 LAB — PREALBUMIN: Prealbumin: 8.4 mg/dL — ABNORMAL LOW (ref 18–38)

## 2014-10-06 LAB — TRANSFERRIN: Transferrin: 161 mg/dL — ABNORMAL LOW (ref 180–329)

## 2014-10-06 MED ORDER — OXYCODONE HCL 5 MG PO TABS
10.0000 mg | ORAL_TABLET | ORAL | Status: DC | PRN
  Administered 2014-10-06 – 2014-10-07 (×5): 20 mg via ORAL
  Filled 2014-10-06 (×5): qty 4

## 2014-10-06 MED ORDER — HYDROMORPHONE HCL 1 MG/ML IJ SOLN
0.5000 mg | INTRAMUSCULAR | Status: DC | PRN
  Administered 2014-10-06: 0.5 mg via INTRAVENOUS
  Filled 2014-10-06: qty 1

## 2014-10-06 MED ORDER — POLYETHYLENE GLYCOL 3350 17 G PO PACK
17.0000 g | PACK | Freq: Every day | ORAL | Status: DC
  Administered 2014-10-06 – 2014-10-07 (×2): 17 g via ORAL
  Filled 2014-10-06 (×2): qty 1

## 2014-10-06 MED ORDER — VITAMIN D 1000 UNITS PO TABS
1000.0000 [IU] | ORAL_TABLET | Freq: Two times a day (BID) | ORAL | Status: DC
  Administered 2014-10-06 – 2014-10-07 (×3): 1000 [IU] via ORAL
  Filled 2014-10-06 (×3): qty 1

## 2014-10-06 MED ORDER — DOCUSATE SODIUM 100 MG PO CAPS
100.0000 mg | ORAL_CAPSULE | Freq: Two times a day (BID) | ORAL | Status: DC
Start: 2014-10-06 — End: 2014-10-07
  Administered 2014-10-06 – 2014-10-07 (×3): 100 mg via ORAL
  Filled 2014-10-06 (×3): qty 1

## 2014-10-06 MED ORDER — VITAMIN D (ERGOCALCIFEROL) 1.25 MG (50000 UNIT) PO CAPS
50000.0000 [IU] | ORAL_CAPSULE | ORAL | Status: DC
  Administered 2014-10-06: 50000 [IU] via ORAL
  Filled 2014-10-06: qty 1

## 2014-10-06 MED ORDER — VITAMIN C 500 MG PO TABS
500.0000 mg | ORAL_TABLET | Freq: Two times a day (BID) | ORAL | Status: DC
  Administered 2014-10-06 – 2014-10-07 (×2): 500 mg via ORAL
  Filled 2014-10-06 (×2): qty 1
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
## 2014-10-06 NOTE — Progress Notes (Signed)
  Radiation Oncology         [phone] ________________________________  Name: [name] RudeRichard Snowden MRN: [id number]  Date: 10/05/2014  DOB: 11/19/1965  SIMULATION AND TREATMENT PLANNING NOTE  DIAGNOSIS:  Postoperative heterotopic ossification  Site:  Left hip  NARRATIVE:  The patient was brought to the treatment suite.  Identity was confirmed.  All relevant records and images related to the planned course of therapy were reviewed.   Written consent to proceed with treatment was confirmed which was freely given after reviewing the details related to the planned course of therapy had been reviewed with the patient.  Then, the patient was set-up in a stable reproducible supine position for radiation therapy.  Xrays were obtained of the target hip area.     The images were reviewed and 2 treatment fields were designed and positioned to treat the appropriate target region. A simple isodose plan is requested.  PLAN:  The patient will receive 7 Gy in 1 fraction.    Simulation verification Port films were taken prior to treatment. Each of the treatment fields was reviewed and appropriately delineates the target regions and the patient was appropriate to proceed with radiation treatment.  ________________________________   Radene GunningJohn S. Caison Hearn, MD, PhD
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
## 2014-10-06 NOTE — Clinical Social Work Note (Signed)
Clinical Social Worker continuing to follow patient and family for support and discharge planning needs.  Patient is being evaluated by inpatient rehab for potential admission.  Insurance authorization has been initiated.  Per inpatient rehab admissions coordinator note, patient will need to have his diet advanced, BM, and pain control off PCA prior to admission.  CSW remains available for support and to assist with discharge planning needs if patient receives denial for inpatient rehab.   Macario GoldsJesse Bliss Behnke, Kentucky[place]CSW [phone]
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
## 2014-10-06 NOTE — Progress Notes (Signed)
I met with pt at bedside to discuss a possible inpt rehab admission. He is in agreement. I have begun insurance authorization today. Pt has been on clear liquids to date and has had no BM since admission. History of hernia repair 10 yrs ago and typically has multiple BMs per day. Also pt on PCA for pain control. Await advancement of diet, BM and pain control maintenance off PCA before considering pt for admission to inpt rehab. I will follow daily. [phone]

## 2014-10-07 ENCOUNTER — Inpatient Hospital Stay (HOSPITAL_COMMUNITY)
Admission: AD | Admit: 2014-10-07 | Discharge: 2014-10-14 | DRG: 560 | Disposition: A | Discharge status: Home Health | Payer: 59 | Source: Intra-hospital | Attending: Physical Medicine & Rehabilitation | Admitting: Physical Medicine & Rehabilitation

## 2014-10-07 ENCOUNTER — Inpatient Hospital Stay (HOSPITAL_COMMUNITY): Admission: RE | Admit: 2014-10-07 | Payer: 59 | Source: Intra-hospital | Admitting: Physical Medicine & Rehabilitation

## 2014-10-07 ENCOUNTER — Encounter (HOSPITAL_COMMUNITY): Payer: Self-pay | Admitting: Emergency Medicine

## 2014-10-07 DIAGNOSIS — M7989 Other specified soft tissue disorders: Secondary | ICD-10-CM | POA: Diagnosis not present

## 2014-10-07 DIAGNOSIS — S72402D Unspecified fracture of lower end of left femur, subsequent encounter for closed fracture with routine healing: Secondary | ICD-10-CM | POA: Diagnosis present

## 2014-10-07 DIAGNOSIS — S7292XB Unspecified fracture of left femur, initial encounter for open fracture type I or II: Secondary | ICD-10-CM | POA: Diagnosis present

## 2014-10-07 DIAGNOSIS — S7292XS Unspecified fracture of left femur, sequela: Secondary | ICD-10-CM

## 2014-10-07 DIAGNOSIS — S32402D Unspecified fracture of left acetabulum, subsequent encounter for fracture with routine healing: Secondary | ICD-10-CM

## 2014-10-07 DIAGNOSIS — D62 Acute posthemorrhagic anemia: Secondary | ICD-10-CM | POA: Diagnosis present

## 2014-10-07 DIAGNOSIS — S32402S Unspecified fracture of left acetabulum, sequela: Secondary | ICD-10-CM | POA: Diagnosis not present

## 2014-10-07 DIAGNOSIS — F4321 Adjustment disorder with depressed mood: Secondary | ICD-10-CM | POA: Insufficient documentation

## 2014-10-07 DIAGNOSIS — K5900 Constipation, unspecified: Secondary | ICD-10-CM | POA: Diagnosis present

## 2014-10-07 DIAGNOSIS — K56609 Unspecified intestinal obstruction, unspecified as to partial versus complete obstruction: Secondary | ICD-10-CM

## 2014-10-07 DIAGNOSIS — K5901 Slow transit constipation: Secondary | ICD-10-CM | POA: Diagnosis not present

## 2014-10-07 DIAGNOSIS — E559 Vitamin D deficiency, unspecified: Secondary | ICD-10-CM | POA: Diagnosis present

## 2014-10-07 DIAGNOSIS — S32402A Unspecified fracture of left acetabulum, initial encounter for closed fracture: Secondary | ICD-10-CM | POA: Diagnosis present

## 2014-10-07 DIAGNOSIS — S32409A Unspecified fracture of unspecified acetabulum, initial encounter for closed fracture: Secondary | ICD-10-CM | POA: Insufficient documentation

## 2014-10-07 LAB — ANAEROBIC CULTURE

## 2014-10-07 LAB — CBC
HEMATOCRIT: 28.2 % — AB (ref 39.0–52.0)
Hemoglobin: 9.8 g/dL — ABNORMAL LOW (ref 13.0–17.0)
MCH: 31.4 pg (ref 26.0–34.0)
MCHC: 34.8 g/dL (ref 30.0–36.0)
MCV: 90.4 fL (ref 78.0–100.0)
RBC: 3.12 MIL/uL — ABNORMAL LOW (ref 4.22–5.81)
RDW: 13.3 % (ref 11.5–15.5)
WBC: 9.7 10*3/uL (ref 4.0–10.5)

## 2014-10-07 LAB — TESTOSTERONE: Testosterone: 42 ng/dL — ABNORMAL LOW (ref 348–1197)

## 2014-10-07 LAB — HEMOGLOBIN A1C
Hgb A1c MFr Bld: 5.2 % (ref 4.8–5.6)
MEAN PLASMA GLUCOSE: 103 mg/dL

## 2014-10-07 LAB — PTH, INTACT AND CALCIUM
CALCIUM TOTAL (PTH): 8.4 mg/dL — AB (ref 8.7–10.2)
PTH: 32 pg/mL (ref 15–65)

## 2014-10-07 LAB — CREATININE, SERUM
CREATININE: 0.71 mg/dL (ref 0.61–1.24)
Creatinine, Ser: 0.66 mg/dL (ref 0.61–1.24)
GFR calc Af Amer: >60 mL/min (ref >60)
GFR calc non Af Amer: >60 mL/min (ref >60)

## 2014-10-07 LAB — TESTOSTERONE, FREE: Testosterone, Free: 1.1 pg/mL — ABNORMAL LOW (ref 6.8–21.5)

## 2014-10-07 LAB — SEX HORMONE BINDING GLOBULIN: Sex Hormone Binding: 27 nmol/L (ref 16.5–55.9)

## 2014-10-07 MED ORDER — ENOXAPARIN SODIUM 40 MG/0.4ML ~~LOC~~ SOLN
40.0000 mg | Freq: Every day | SUBCUTANEOUS | Status: DC
Start: 2014-10-08 — End: 2014-10-14
  Administered 2014-10-08 – 2014-10-14 (×7): 40 mg via SUBCUTANEOUS
  Filled 2014-10-07 (×9): qty 0.4

## 2014-10-07 MED ORDER — POLYETHYLENE GLYCOL 3350 17 G PO PACK
17.0000 g | PACK | Freq: Every day | ORAL | Status: DC
  Administered 2014-10-08 – 2014-10-14 (×6): 17 g via ORAL
  Filled 2014-10-07 (×9): qty 1

## 2014-10-07 MED ORDER — ENOXAPARIN SODIUM 40 MG/0.4ML ~~LOC~~ SOLN: 40.0000 mg | SUBCUTANEOUS | Status: DC

## 2014-10-07 MED ORDER — DOCUSATE SODIUM 100 MG PO CAPS
100.0000 mg | ORAL_CAPSULE | Freq: Two times a day (BID) | ORAL | Status: DC
  Administered 2014-10-07 – 2014-10-14 (×13): 100 mg via ORAL
  Filled 2014-10-07 (×17): qty 1

## 2014-10-07 MED ORDER — VITAMIN D (ERGOCALCIFEROL) 1.25 MG (50000 UNIT) PO CAPS: 50000.0000 [IU] | ORAL_CAPSULE | ORAL | Status: DC

## 2014-10-07 MED ORDER — VITAMIN D3 25 MCG (1000 UNIT) PO TABS
1000.0000 [IU] | ORAL_TABLET | Freq: Two times a day (BID) | ORAL | Status: DC
  Administered 2014-10-07 – 2014-10-14 (×14): 1000 [IU] via ORAL
  Filled 2014-10-07 (×17): qty 1

## 2014-10-07 MED ORDER — OXYCODONE HCL 5 MG PO TABS
10.0000 mg | ORAL_TABLET | ORAL | Status: DC | PRN
  Administered 2014-10-07 (×3): 10 mg via ORAL
  Administered 2014-10-08 – 2014-10-10 (×13): 15 mg via ORAL
  Administered 2014-10-10: 20 mg via ORAL
  Administered 2014-10-11 – 2014-10-14 (×17): 15 mg via ORAL
  Filled 2014-10-07 (×6): qty 3
  Filled 2014-10-07: qty 2
  Filled 2014-10-07 (×4): qty 3
  Filled 2014-10-07: qty 4
  Filled 2014-10-07 (×6): qty 3
  Filled 2014-10-07: qty 4
  Filled 2014-10-07 (×5): qty 3
  Filled 2014-10-07: qty 2
  Filled 2014-10-07 (×4): qty 3
  Filled 2014-10-07: qty 4
  Filled 2014-10-07: qty 2
  Filled 2014-10-07 (×3): qty 3

## 2014-10-07 MED ORDER — METHOCARBAMOL 500 MG PO TABS
500.0000 mg | ORAL_TABLET | Freq: Four times a day (QID) | ORAL | Status: DC | PRN
  Administered 2014-10-07 – 2014-10-10 (×8): 500 mg via ORAL
  Filled 2014-10-07 (×8): qty 1

## 2014-10-07 MED ORDER — SENNA 8.6 MG PO TABS
1.0000 | ORAL_TABLET | Freq: Two times a day (BID) | ORAL | Status: DC
  Administered 2014-10-07 – 2014-10-14 (×13): 8.6 mg via ORAL
  Filled 2014-10-07 (×16): qty 1

## 2014-10-07 MED ORDER — ACETAMINOPHEN 325 MG PO TABS
325.0000 mg | ORAL_TABLET | ORAL | Status: DC | PRN
  Administered 2014-10-10: 650 mg via ORAL
  Filled 2014-10-07: qty 2

## 2014-10-07 MED ORDER — BISACODYL 10 MG RE SUPP: 10.0000 mg | Freq: Every day | RECTAL | Status: DC | PRN

## 2014-10-07 MED ORDER — BISACODYL 10 MG RE SUPP
10.0000 mg | Freq: Every day | RECTAL | Status: DC | PRN
Start: 2014-10-07 — End: 2014-10-07

## 2014-10-07 MED ORDER — ONDANSETRON HCL 4 MG PO TABS: 4.0000 mg | ORAL_TABLET | Freq: Four times a day (QID) | ORAL | Status: DC | PRN

## 2014-10-07 MED ORDER — ACETAMINOPHEN 325 MG PO TABS: 650.0000 mg | ORAL_TABLET | Freq: Four times a day (QID) | ORAL | Status: DC | PRN

## 2014-10-07 MED ORDER — MAGNESIUM CITRATE PO SOLN
1.0000 | Freq: Once | ORAL | Status: AC | PRN
  Administered 2014-10-07: 1 via ORAL
  Filled 2014-10-07: qty 296

## 2014-10-07 MED ORDER — ONDANSETRON HCL 4 MG/2ML IJ SOLN: 4.0000 mg | Freq: Four times a day (QID) | INTRAMUSCULAR | Status: DC | PRN

## 2014-10-07 MED ORDER — SORBITOL 70 % SOLN: 30.0000 mL | Freq: Every day | Status: DC | PRN

## 2014-10-07 MED ORDER — VITAMIN C 500 MG PO TABS
500.0000 mg | ORAL_TABLET | Freq: Two times a day (BID) | ORAL | Status: DC
  Administered 2014-10-07 – 2014-10-14 (×14): 500 mg via ORAL
  Filled 2014-10-07 (×17): qty 1

## 2014-10-07 MED ORDER — TRAMADOL HCL 50 MG PO TABS
50.0000 mg | ORAL_TABLET | Freq: Four times a day (QID) | ORAL | Status: DC
  Administered 2014-10-07: 50 mg via ORAL
  Filled 2014-10-07: qty 1
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
## 2014-10-07 NOTE — Clinical Social Work Note (Signed)
Clinical Social Worker continuing to follow patient and family for support and discharge planning needs.  Patient has been accepted to inpatient rehab with plans to transfer today.  Patient is in agreement.  Clinical Social Worker will sign off for now as social work intervention is no longer needed. Please consult us again if new need arises.  Macario GoldsJesse Vermon Grays, KentuckyLCSW [phone]

## 2014-10-07 NOTE — Progress Notes (Signed)
Patient ID: [name] [name], male   DOB: 01/14/1966, 49 y.o.   MRN: [id number] Patient admitted to 4M08 via recliner, escorted by nursing staff and family.  Patient verbalized understanding of rehab process, signed fall safety agreement.  Patient appears to be in no immediate distress at this time.  Will continue to monitor.  Dani Gobbleeardon, Brittain Smithey J, RN

## 2014-10-07 NOTE — Progress Notes (Signed)
Orthopedic Tech Progress Note Patient Details:  Trixie RudeRichard Philbrook 04/08/1966 [id number]  Patient ID: Trixie Rudeichard Abshier, male   DOB: 03/24/1966, 49 y.o.   MRN: [id number] Correction: called in advanced brace order; spoke with Ferdinand LangoKanisha  Esa Raden 10/07/2014, 9:43 AM
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
## 2014-10-07 NOTE — Progress Notes (Signed)
Orthopedic Tech Progress Note Patient Details:  [name] RudeRichard [name] 09/27/1965 [id number]  Patient ID: [name] [name], male   DOB: 05/05/1966, 49 y.o.   MRN: [id number] Called in advanced brace order; spoke with [name] [name]  [name] [name] 10/07/2014, 9:36 AM
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
## 2014-10-07 NOTE — Interval H&P Note (Signed)
[name] [name] was admitted today to Inpatient Rehabilitation with the diagnosis of left acetabular/femur fx.  The patient's history has been reviewed, patient examined, and there is no change in status.  Patient continues to be appropriate for intensive inpatient rehabilitation.  I have reviewed the patient's chart and labs.  Questions were answered to the patient's satisfaction.  Trung Wenzl T 10/07/2014, 10:00 PM
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
## 2014-10-08 ENCOUNTER — Inpatient Hospital Stay (HOSPITAL_COMMUNITY): Payer: 59 | Admitting: Rehabilitation

## 2014-10-08 ENCOUNTER — Inpatient Hospital Stay (HOSPITAL_COMMUNITY): Payer: 59 | Admitting: Occupational Therapy

## 2014-10-08 ENCOUNTER — Inpatient Hospital Stay (HOSPITAL_COMMUNITY): Payer: 59

## 2014-10-08 DIAGNOSIS — M7989 Other specified soft tissue disorders: Secondary | ICD-10-CM

## 2014-10-08 LAB — CBC WITH DIFFERENTIAL/PLATELET
Basophils Absolute: 0.1 10*3/uL (ref 0.0–0.1)
Basophils Relative: 1 % (ref 0–1)
EOS PCT: 2 % (ref 0–5)
Eosinophils Absolute: 0.2 10*3/uL (ref 0.0–0.7)
HCT: 29.1 % — ABNORMAL LOW (ref 39.0–52.0)
Hemoglobin: 9.9 g/dL — ABNORMAL LOW (ref 13.0–17.0)
LYMPHS ABS: 2.5 10*3/uL (ref 0.7–4.0)
Lymphocytes Relative: 23 % (ref 12–46)
MCH: 31.2 pg (ref 26.0–34.0)
MCHC: 34 g/dL (ref 30.0–36.0)
MCV: 91.8 fL (ref 78.0–100.0)
Monocytes Absolute: 1.3 10*3/uL — ABNORMAL HIGH (ref 0.1–1.0)
Monocytes Relative: 12 % (ref 3–12)
NEUTROS PCT: 62 % (ref 43–77)
Neutro Abs: 6.8 10*3/uL (ref 1.7–7.7)
PLATELETS: 631 10*3/uL — AB (ref 150–400)
RBC: 3.17 MIL/uL — ABNORMAL LOW (ref 4.22–5.81)
RDW: 13.4 % (ref 11.5–15.5)
WBC: 10.9 10*3/uL — ABNORMAL HIGH (ref 4.0–10.5)

## 2014-10-08 LAB — COMPREHENSIVE METABOLIC PANEL
ALK PHOS: 118 U/L (ref 38–126)
ALT: 65 U/L — AB (ref 17–63)
AST: 169 U/L — AB (ref 15–41)
Albumin: 2.7 g/dL — ABNORMAL LOW (ref 3.5–5.0)
Anion gap: 12 (ref 5–15)
BUN: 9 mg/dL (ref 6–20)
CHLORIDE: 99 mmol/L — AB (ref 101–111)
CO2: 24 mmol/L (ref 22–32)
Calcium: 8.9 mg/dL (ref 8.9–10.3)
Creatinine, Ser: 0.64 mg/dL (ref 0.61–1.24)
GLUCOSE: 109 mg/dL — AB (ref 65–99)
POTASSIUM: 4.2 mmol/L (ref 3.5–5.1)
SODIUM: 135 mmol/L (ref 135–145)
Total Bilirubin: 1.2 mg/dL (ref 0.3–1.2)
Total Protein: 6.9 g/dL (ref 6.5–8.1)

## 2014-10-08 MED ORDER — MAGNESIUM HYDROXIDE 400 MG/5ML PO SUSP
960.0000 mL | Freq: Once | ORAL | Status: AC
  Administered 2014-10-08: 960 mL via RECTAL
  Filled 2014-10-08: qty 240

## 2014-10-08 NOTE — Progress Notes (Signed)
Physical Therapy Session Note  Patient Details  Name: [name] [name] MRN: [id number] Date of Birth: 11/10/1965  Today's Date: 10/08/2014 PT Individual Time: 1500-1600 PT Individual Time Calculation (min): 60 min   Short Term Goals: Week 1:  PT Short Term Goal 1 (Week 1): =LTG's due to ELOS  Skilled Therapeutic Interventions/Progress Updates:  Skilled session focused on gait training with use of RW for improved quality and distance, supine therex for strength and ROM, negotiation up/down curb step for community integration, and car transfer to simulated home car for safe D/C.  Pt self propelled to/from therapy gym x 100' at S level with min cues for safety.  Once in therapy gym, performed supine<>sit on therapy mat at S level while maintaining THP.  While in supine, performed active assisted heel slides, quad sets, glute sets, hip abd/add and SLR on LLE all x 10 reps.  Introduced use of leg lifter for exercise, but want to get cleared from ortho MD before using.  Pt verbalized understanding.  Performed curb negotiation with use of RW at min/guard level with demonstration cues on sequencing and technique.  Ambulated x 80' with RW at S level with min cues for posture.  Ended session with car transfer.  PT educated and demonstrated getting in/out of car safely in front or back seat and how to maintain hip precautions.  Pt returned demonstration with min A for LLE into/out of car safely.  Provided education on getting into front seat if possible to allow seat belt to be buckled.  Pt verbalized understanding.  Pt propelled back to room and left in bed to go for doppler study.  Communicated with RN to place frozen ice packs (PT placed in freezer) in brace when pt returns to assist with swelling and pain.    Therapy Documentation Precautions:  Precautions Precautions: Posterior Hip Precaution Booklet Issued: Yes (comment) Precaution Comments: Reviewed posterior hip precautions and NWB>LLE Required  Braces or Orthoses: Knee Immobilizer - Left Knee Immobilizer - Left: On at all times Restrictions Weight Bearing Restrictions: Yes LLE Weight Bearing: Non weight bearing (on left leg)   Vital Signs: Therapy Vitals Temp: 98.7 F (37.1 C) Temp Source: Oral Pulse Rate: 88 Resp: 18 BP: 118/72 mmHg Patient Position (if appropriate): Lying Oxygen Therapy SpO2: 100 % O2 Device: Not Delivered Pain: Pain Assessment Pain Assessment: 0-10 Pain Score: 4  Pain Type: Acute pain;Surgical pain Pain Location: Leg Pain Orientation: Left Pain Descriptors / Indicators: Aching Pain Frequency: Intermittent Pain Onset: With Activity Patients Stated Pain Goal: 3 Pain Intervention(s): Medication (See eMAR)  See FIM for current functional status  Therapy/Group: Individual Therapy  Vista Deckarcell, [name] 10/08/2014, 4:13 PM

## 2014-10-08 NOTE — Progress Notes (Signed)
*  Preliminary Results* Bilateral lower extremity venous duplex completed. Bilateral lower extremities are negative for deep vein thrombosis. There is no evidence of Baker's cyst bilaterally.  10/08/2014  Swayzie Choate, RVT, RDCS, RDMS  

## 2014-10-08 NOTE — Progress Notes (Signed)
Patient information reviewed and entered into eRehab system by Dilyn Osoria, RN, CRRN, PPS Coordinator.  Information including medical coding and functional independence measure will be reviewed and updated through discharge.    
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
## 2014-10-08 NOTE — Progress Notes (Signed)
Occupational Therapy Session Note  Patient Details  Name: [name] HaneRichard A Wotton MRN: [id number] Date of Birth: 10/05/1965  Today's Date: 10/08/2014 OT Individual Time: 1330-1400 OT Individual Time Calculation (min): 30 min    Short Term Goals: Week 1:  OT Short Term Goal 1 (Week 1): STG = LTGs due to ELOS  Skilled Therapeutic Interventions/Progress Updates:    Upon entering the room, pt seated in wheelchair with 7/10 c/o pain in L hip. OT educated pt on pain management strategies in order to keep pain at manageable level for therapy sessions. Pt able to discuss what has alleviated vs. Increased pain. STS from wheelchair with supervision and RW. Pt ambulating ~ 15 feet to bathroom maintaining NWB status with us of RW and min guard - SBA. Pt standing to void over toilet with steady assist during clothing management. Pt ambulated ~ 10 feet to sink in same manner to wash hands with SBA. Pt returning to sit in wheelchair secondary to fatigue to rest. OT educating and demonstrating use of AE for bathing and dressing tasks. Education to continue. Pt performing stand pivot transfer with RW into bed from wheelchair with steady assist. Sit >supine with supervision. Call bell and all needed items within reach upon exiting the room.   Therapy Documentation Precautions:  Precautions Precautions: Posterior Hip Precaution Booklet Issued: Yes (comment) Precaution Comments: Reviewed posterior hip precautions and NWB>LLE Required Braces or Orthoses: Knee Immobilizer - Left Knee Immobilizer - Left: On at all times Restrictions Weight Bearing Restrictions: Yes LLE Weight Bearing: Non weight bearing (on left leg)  See FIM for current functional status  Therapy/Group: Individual Therapy  Lowella Gripittman, Diaz Crago L 10/08/2014, 4:54 PM

## 2014-10-08 NOTE — Progress Notes (Signed)
Orthopedic Tech Progress Note Patient Details:  Desiree HaneRichard A [place]devant 03/16/1966 [id number] Brace order completed by [employer] vendor. Patient ID: Desiree Haneichard A Fadely, male   DOB: 04/23/1966, 49 y.o.   MRN: [id number]   Jennye MoccasinHughes, Ajanay Farve Craig 10/08/2014, 3:04 PM

## 2014-10-08 NOTE — Progress Notes (Signed)
Administered SMOG enema per MD order.  Large amount of liquid brown, yellowish stool with mucous and small amount of blood noted.  Deatra Inaan Angiulli, PA notified of results, new order for KUB received and entered.  Will continue to monitor.  Dani Gobbleeardon, Colie Josten J, RN
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
## 2014-10-09 ENCOUNTER — Inpatient Hospital Stay (HOSPITAL_COMMUNITY): Payer: 59 | Admitting: Occupational Therapy

## 2014-10-09 ENCOUNTER — Inpatient Hospital Stay (HOSPITAL_COMMUNITY): Payer: 59 | Admitting: Physical Therapy

## 2014-10-09 DIAGNOSIS — K5901 Slow transit constipation: Secondary | ICD-10-CM
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
## 2014-10-09 NOTE — Progress Notes (Signed)
Occupational Therapy Session Note  Patient Details  Name: [name] HaneRichard A [name] MRN: [id number] Date of Birth: 04/04/1966  Today's Date: 10/09/2014 OT Individual Time:  -   0830-100  (90 min)  1st session                                          1330-1430  (60 min)  2nd session      Short Term Goals: Week 1:  OT Short Term Goal 1 (Week 1): STG = LTGs due to ELOS Week 2:     Skilled Therapeutic Interventions/Progress Updates:       1st session: Addressed functional mobility, sit to stand, standing balance, AE instruction and use, transfers.   Upon entering the room, pt seated in wheelchair with 7/10 c/o pain in L hip. OT educated pt on pain management strategies in order to keep pain at manageable level for therapy sessions. Pt. Using ice currently and pain meds.  He stated moving around helped as well.   STS from wheelchair with supervision and RW. Pt ambulating ~ 15 feet to bathroom maintaining NWB status with us of RW and min guard - SBA. Pt transferred to tub bench.  OT assisted with emoving leg brace.  Performed UE 2 # weights for 15 reps shoulder press.  Transferred back to bed and left with all needs in reach. 2nd session:   Addressed wc mobility down to gift shop.  Pt pain was 7/10 with some light headedness.  Pt wheeled in wc rather than walking due to light headedness.  Pt rolled self out on side walk and down / up incline.  Propelled wc back to room.  Transferred to toilet and left with nursing.      Therapy Documentation Precautions:  Precautions Precautions: Posterior Hip Precaution Booklet Issued: Yes (comment) Precaution Comments: Reviewed posterior hip precautions and NWB>LLE Required Braces or Orthoses: Knee Immobilizer - Left Knee Immobilizer - Left: On at all times Restrictions Weight Bearing Restrictions: Yes LLE Weight Bearing: Non weight bearing   Pain: Pain Assessment Pain Score: 7  Both sessions     See FIM for current functional status  Therapy/Group:  Individual Therapy  [name], [name] 10/09/2014, 8:14 AM

## 2014-10-09 NOTE — Progress Notes (Signed)
Physical Therapy Session Note  Patient Details  Name: [name] [name] MRN: [id number] Date of Birth: 05/22/1966  Today's Date: 10/09/2014 PT Individual Time: 1100-1200 PT Individual Time Calculation (min): 60 min   Short Term Goals: Week 1:  PT Short Term Goal 1 (Week 1): =LTG's due to ELOS  Skilled Therapeutic Interventions/Progress Updates:    Therapeutic Activity: PT instructs pt in supine to sit from flat bed without rails req SBA through modified technique, sit to stand with RW from EOB req SBA, and SBA during stand-step transfer with RW from bed to w/c.  PT instructs pt in stand-hop transfer with RW w/c to/from mat req SBA and mat mobility sit to/from supine transfer through modified long sit req SBA.   W/C Management: Pt demonstrates mod I w/c propulsion on level surface in controlled environment, req cues for technique to ascend/descend ramp in w/c req SBA progressing to mod I.   Gait Training: PT instructs pt in ambulation with RW x 92' req SBA for safety. PT instructs pt in ambulating up/down a ramp with RW req SBA for safety.   Therapeutic Exercise: PT instructs pt in L LE ROM/strengthening exercises with brace on (in unlocked position): AAROM L knee flexion, AAROM supine hip abduction/adduction: 2 x 10 reps each.   Pt is progressing with functional mobility - ROM/strengthening exercises were the most difficult for this patient, as well as limited activity tolerance. Continue per PT POC.   Therapy Documentation Precautions:  Precautions Precautions: Posterior Hip Precaution Booklet Issued: Yes (comment) Precaution Comments: Reviewed posterior hip precautions and NWB>LLE Required Braces or Orthoses: Knee Immobilizer - Left Knee Immobilizer - Left: On at all times Restrictions Weight Bearing Restrictions: Yes LLE Weight Bearing: Non weight bearing Pain: Pain Assessment Pain Assessment: 0-10 Pain Score: 6  Pain Type: Surgical pain Pain Location: Leg Pain  Orientation: Left Pain Descriptors / Indicators: Aching Pain Onset: With Activity Patients Stated Pain Goal: 3 Pain Intervention(s): Medication (See eMAR);Rest (premedicated) Multiple Pain Sites: No  See FIM for current functional status  Therapy/Group: Individual Therapy  [name] 10/09/2014, 8:58 AM
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
## 2014-10-09 NOTE — Progress Notes (Signed)
[facility] Individual Statement of Services  Patient Name:  [name] [name]  Date:  10/09/2014  Welcome [facility] the [facility].  Our goal is to provide you with an individualized program based on your diagnosis and situation, designed to meet your specific needs.  With this comprehensive rehabilitation program, you will be expected to participate in at least 3 hours of rehabilitation therapies Monday-Friday, with modified therapy programming on the weekends.  Your rehabilitation program will include the following services:  Physical Therapy (PT), Occupational Therapy (OT), 24 hour per day rehabilitation nursing, Neuropsychology, Case Management (Social Worker), Rehabilitation Medicine, Nutrition Services and Pharmacy Services  Weekly team conferences will be held on Wednesdays to discuss your progress.  Your Social Worker will talk with you frequently to get your input and to update you on team discussions.  Team conferences with you and your family in attendance may also be held.  Expected length of stay:  7 days Overall anticipated outcome:  Modified independent  Depending on your progress and recovery, your program may change. Your Social Worker will coordinate services and will keep you informed of any changes. Your Social Worker's name and contact numbers are listed  below.  The following services may also be recommended but are not provided by the [facility]:   Driving Evaluations  Home Health Rehabiltiation Services  Outpatient Rehabilitation Services  Vocational Rehabilitation   Arrangements will be made to provide these services after discharge if needed.  Arrangements include referral to agencies that provide these services.  Your insurance has been verified to be:  [employer] Your primary doctor is:  None at this time.  We encourage you to find a primary care doctor.  Social worker can assist as  desired.  Pertinent information will be shared with your doctor and your insurance company.  Social Worker:  [name], LCSW  [phone] or ([phone]) [phone]  Information discussed with and copy given to patient by: [name], [name], 10/09/2014, 12:32 PM

## 2014-10-10 ENCOUNTER — Inpatient Hospital Stay (HOSPITAL_COMMUNITY): Payer: 59 | Admitting: Physical Therapy

## 2014-10-10 ENCOUNTER — Inpatient Hospital Stay (HOSPITAL_COMMUNITY): Payer: Managed Care, Other (non HMO) | Admitting: Occupational Therapy

## 2014-10-10 LAB — CULTURE, BLOOD (SINGLE): Culture: NO GROWTH

## 2014-10-10 NOTE — Progress Notes (Signed)
Physical Therapy Session Note  Patient Details  Name: [name] [name] MRN: [id number] Date of Birth: 01/29/1966  Today's Date: 10/10/2014 PT Individual Time: 0800-0900 PT Individual Time Calculation (min): 60 min   Short Term Goals: Week 1:  PT Short Term Goal 1 (Week 1): =LTG's due to ELOS  Skilled Therapeutic Interventions/Progress Updates:    Gait Training: PT instructs pt in ambulation with RW x 125' req SBA for safety.  PT instructs pt in ascending/descending stairs: 3 low ones with R rail and PT under L arm req min A, progressing to 4 standard height (6") steps with R rail and PT under L arm req min A.   Therapeutic Exercise: PT instructs pt in L LE ROM/strengthening exercises: AAROM knee flexion (slight overpressure at "end range" to assist in progressing knee flexion), AAROM supine hip abduction/adduction, ankle pumps, SAQ (partial ROM) with pillow under knee: 2 x 10 reps.   Community Integration: PT instructs pt in w/c management in community setting req SBA, progressing to mod I indcluding: on/off elevator with pt managing buttons, on various level surfaces (tile, carpet, cement, brick), and up/down a steep ramp/hill, and on uneven sloped surfaces.   Pt reports a delay in the ramp being built and will need to be able to ascend stairs to enter home - bilateral handrails in place, but pt can only reach one handrail at a time. Pt is slowly progressing with activity tolerance. Continue per PT POC.   Therapy Documentation Precautions:  Precautions Precautions: Posterior Hip Precaution Booklet Issued: Yes (comment) Precaution Comments: Reviewed posterior hip precautions and NWB>LLE Required Braces or Orthoses: Knee Immobilizer - Left Knee Immobilizer - Left: On at all times Restrictions Weight Bearing Restrictions: Yes LLE Weight Bearing: Non weight bearing Pain: Pain Assessment Pain Assessment: 0-10 Pain Score: 7  Pain Type: Acute pain Pain Location: Leg Pain  Orientation: Left Pain Descriptors / Indicators: Aching;Discomfort Pain Frequency: Occasional Pain Onset: On-going Patients Stated Pain Goal: 3 Pain Intervention(s): Medication (See eMAR) Multiple Pain Sites: Yes 2nd Pain Site Pain Score: 4 Pain Type: Acute pain Pain Location: Buttocks Pain Descriptors / Indicators: Sore Pain Intervention(s): Repositioned 3rd Pain Site Pain Score: 6 Pain Type: Acute pain Pain Location: Hand (thumb) Pain Orientation: Right Pain Descriptors / Indicators: Aching Pain Onset: On-going Pain Intervention(s): Rest;Emotional support  See FIM for current functional status  Therapy/Group: Individual Therapy  [name] 10/10/2014, 7:49 AM
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
## 2014-10-11 ENCOUNTER — Inpatient Hospital Stay (HOSPITAL_COMMUNITY): Payer: 59 | Admitting: Occupational Therapy

## 2014-10-11 ENCOUNTER — Inpatient Hospital Stay (HOSPITAL_COMMUNITY): Payer: 59 | Admitting: Physical Therapy

## 2014-10-11 LAB — TESTOSTERONE, % FREE: TESTOSTERONE-% FREE: 1.4 % — AB (ref 0.2–0.7)

## 2014-10-11 MED ORDER — METHOCARBAMOL 500 MG PO TABS
500.0000 mg | ORAL_TABLET | Freq: Four times a day (QID) | ORAL | Status: DC
  Administered 2014-10-11 – 2014-10-14 (×13): 500 mg via ORAL
  Filled 2014-10-11 (×18): qty 1

## 2014-10-11 MED ORDER — HYDROCORTISONE ACE-PRAMOXINE 1-1 % RE FOAM
1.0000 | Freq: Two times a day (BID) | RECTAL | Status: AC
  Administered 2014-10-11 – 2014-10-13 (×5): 1 via RECTAL
  Filled 2014-10-11 (×2): qty 10
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
## 2014-10-11 NOTE — Progress Notes (Signed)
Physical Therapy Session Note  Patient Details  Name: [name] HaneRichard A Rutkowski MRN: [id number] Date of Birth: 05/07/1966  Today's Date: 10/11/2014 PT Individual Time: 1430-1545 PT Individual Time Calculation (min): 75 min   Short Term Goals: Week 1:  PT Short Term Goal 1 (Week 1): =LTG's due to ELOS  Skilled Therapeutic Interventions/Progress Updates:  Pt was seen bedside in the pm. Pt propelled w/c for UE strengthening about 250 feet. Pt transferred w/c to edge of mat with rolling walker and S. Pt transferred edge of mat to supine with no assist. While on mat pt performed AAROM L knee flex, hip abd/add, and SAQs, 3 sets x 10 reps each. Pt was able to verbalized 2 out of 3 precautions. Pt transferred supine to edge of mat with no assist. Pt transferred edge of mat to w/c with rolling walker and S. Pt performed LAQs in w/c L LE, 3 sets x 10 reps each. Performed mini squats with R LE, 3 sets x10 reps each. Pt ascended/descended 2 stairs with 1 rail and min A under arm from therapist. Pt ambulated about 80 feet with rolling walker and S. Pt propelled w/c around unit for UE strengthening. Pt returned to room and left sitting up in w/c with call bell within reach. Pt able to maintain WB status throughout all treatment activities.   Therapy Documentation Precautions:  Precautions Precautions: Posterior Hip Precaution Booklet Issued: Yes (comment) Precaution Comments: Reviewed posterior hip precautions and NWB>LLE Required Braces or Orthoses: Knee Immobilizer - Left Knee Immobilizer - Left: On at all times Restrictions Weight Bearing Restrictions: Yes LLE Weight Bearing: Touchdown weight bearing General:    Pain: Pt c/o mild L hip pain.   Locomotion : Ambulation Ambulation/Gait Assistance: 5: Supervision   See FIM for current functional status  Therapy/Group: Individual Therapy  Rayford HalstedMitchell, Arnitra Sokoloski G 10/11/2014, 3:54 PM

## 2014-10-11 NOTE — Progress Notes (Signed)
Occupational Therapy Session Note  Patient Details  Name: [name] [name] MRN: [id number] Date of Birth: 09/06/1965  Today's Date: 10/11/2014 OT Individual Time:  -   0800-0900  (60 min)  1st session                                           1300-1400  (60 min)  2nd session      Short Term Goals: Week 1:  OT Short Term Goal 1 (Week 1): STG = LTGs due to ELOS  Skilled Therapeutic Interventions/Progress Updates:    1st session:  Skilled OT intervention with treatment focus on the following:  Functional mobility, transfers to tub and toilet, wc mobility.  Pt. ambulated from bed to toilet to shower with minimal assist.  Transferred with grab bar and min to SBA.  Pt completed toileting, bathing and dressing in timely manner.  Provided assist with leg orthosis.  Pt used AE for LB bathing and dressing with instructional cues.  He ambulated out to wc and completed grooming standing at sink.  Ambulated to wc and left in room with all needs in reach.    2nd  Session:  Focus of skilled intervention:  Education on safe ambulation during laundry task, balance in standing while performing laundry, and wc mobility.  Pt. Gathered dirty clothes at RW level.  Used wc to propel self to laundry room.  Pt. Ambulated to washer with SBA.  Performed all laundry tasks at SBA level.  Pt. Propelled wc to gym and engaged in UE strengthing exercises using 5 # free weight.  Pt.reported he had shoulder surgery on left several years ago due to his involvement in baseball and sports.  Stood and engaged in boxing for 1 minute for 3 sets.  Pt needed CGA for balance.  Pt. Propelled wc to room and left with all needs in reach.     Therapy Documentation Precautions:  Precautions Precautions: Posterior Hip Precaution Booklet Issued: Yes (comment) Precaution Comments: Reviewed posterior hip precautions and NWB>LLE Required Braces or Orthoses: Knee Immobilizer - Left Knee Immobilizer - Left: On at all times Restrictions Weight  Bearing Restrictions: Yes LLE Weight Bearing: Non weight bearing     Pain: Pain Assessment Pain Assessment: 0-10 Pain Score: 7  Pain Location: Leg Pain Orientation: Left Pain Descriptors / Indicators: Aching Pain Onset: Gradual Pain Intervention(s): Medication (See eMAR)        See FIM for current functional status  Therapy/Group: Individual Therapy  [name], Xochil Shanker J 10/11/2014, 7:49 AM
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
## 2014-10-12 ENCOUNTER — Inpatient Hospital Stay (HOSPITAL_COMMUNITY): Payer: 59 | Admitting: Physical Therapy

## 2014-10-12 ENCOUNTER — Inpatient Hospital Stay (HOSPITAL_COMMUNITY): Payer: 59

## 2014-10-12 ENCOUNTER — Inpatient Hospital Stay (HOSPITAL_COMMUNITY): Payer: 59 | Admitting: Occupational Therapy

## 2014-10-12 ENCOUNTER — Encounter (HOSPITAL_COMMUNITY): Payer: Managed Care, Other (non HMO)

## 2014-10-12 DIAGNOSIS — F4321 Adjustment disorder with depressed mood: Secondary | ICD-10-CM
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
## 2014-10-12 NOTE — Progress Notes (Signed)
Occupational Therapy Session Note  Patient Details  Name: [name] [name] MRN: [id number] Date of Birth: 04/14/1966  Today's Date: 10/12/2014 OT Individual Time: [id number] and 1400-1530 OT Individual Time Calculation (min): 55 min and 90 min   Short Term Goals: Week 1:  OT Short Term Goal 1 (Week 1): STG = LTGs due to ELOS  Skilled Therapeutic Interventions/Progress Updates:    1) Engaged in ADL retraining with focus on functional mobility, transfers, and use of AE to complete self-care tasks.  Pt completed bed mobility with supervision then ambulated to room bathroom with RW and close supervision while maintaining NWB status.  Completed transfer to walk-in shower with backing up to tub bench and completing bathing from seated position with use of long handled sponge.  Therapist assisted with donning and doffing leg brace.  Use of reacher with donning pants, required assist to don Lt sock.  Completed grooming tasks in standing with supervision.  Discussed use of DME with toilet and shower transfers as pt reports toilet too low at home, [facility] to elevated toilet seat and provide UE support for sit <> stand.  Engaged in simulated walk-in shower transfer with recommendation to have door removed and back up to shower stall and sit on seat then pivot into shower instead of hopping over approx 5" shower ledge.  Pt return demonstrated sit pivot transfer with RW and supervision.  2) Engaged in therapeutic activity with focus on overall activity tolerance with community mobility, simple meal prep, and BUE strengthening.  Pt propelled w/c >500 feet to hospital main entrance at Mod I level, navigating elevator and unlevel surfaces.  Pt ambulated >100 feet with RW on uneven surface outside with supervision and negotiated ramp with supervision.  Upon return to unit, pt reports need to toilet. Performed toilet transfers with RW with supervision then Mod I and completed all toileting  tasks at Mod I level with BSC placed over toilet to increase independence with sit > stand and provide increased positioning for LLE.  Engaged in simple meal prep in kitchen, with education on safety with RW in kitchen and use of counters to transport items.  Pt retrieved items from various cabinets with supervision.  Completed BUE strengthening activities with block pushups 3 reps of 10 followed by 5# dowel rod with chest presses and bicep curls.  Pt returned to bed at end of session via squat pivot with supervision.  Therapy Documentation Precautions:  Precautions Precautions: Posterior Hip Precaution Booklet Issued: Yes (comment) Precaution Comments: Reviewed posterior hip precautions and NWB>LLE Required Braces or Orthoses: Knee Immobilizer - Left Knee Immobilizer - Left: On at all times Restrictions Weight Bearing Restrictions: Yes LLE Weight Bearing: Non weight bearing Pain: Pain Assessment Pain Score: 3   See FIM for current functional status  Therapy/Group: Individual Therapy  Rosalio LoudHOXIE, [name] 10/12/2014, 9:35 AM

## 2014-10-12 NOTE — Progress Notes (Addendum)
Physical Therapy Session Note  Patient Details  Name: [name] [name] MRN: [id number] Date of Birth: 02/05/1966  Today's Date: 10/12/2014 PT Individual Time: 1110-1215 PT Individual Time Calculation (min): 65 min   Short Term Goals: Week 1:  PT Short Term Goal 1 (Week 1): =LTG's due to ELOS  Skilled Therapeutic Interventions/Progress Updates:  Sutures had just been removed.  Pt stated his LLE felt better without them.  Reviewed hip precautions.  Pt able to state 3/3, and functionally observed them without cues.  Straps of  L knee brace closed in long sitting in bed with assistance.  Bed mobility and basic transfers with RW with supervision.  tx focused on gait on level tile, carpet, up/down ramp, standing therapeutic exs, UBE, w/c propulsion  W/c propulsion modified independent on unit x 150'.  Pt manages brakes independently, but needs assistance for L legrest. W/c propulsion while transporting empty cup in lap, with supervision, but without dropping, x 150'.  Standing therapeutic exs: 2 x 10 L hip abd, 10 x 1 R mini squat, R cal raises  UBE at level 4 x 7 minutes for bil UE strengthening for RW use, pedlling backwards for upper back and posterior shoulder strengthening.  Gait on level tile and carpet x 150' with supervision; up/down ramp x 2 with min guard assist.  Pt is progressing well; he stated the ramp will not be ready before Sat, unless d/c date requires it.  PT consulted CSW who will contact family to come in for family ed as soon as possble.  PT provided pt with 2 ice packs, which he stated he will use after lunch.    Therapy Documentation Precautions:  Precautions Precautions: Posterior Hip Precaution Booklet Issued: Yes (comment) Precaution Comments: Reviewed posterior hip precautions and NWB>LLE Required Braces or Orthoses: Knee Immobilizer - Left Knee Immobilizer - Left: On at all times Restrictions Weight Bearing Restrictions: Yes LLE Weight Bearing: Non  weight bearing   Pain: Pain Assessment Pain Score: 2  Pain Location: Hip Pain Orientation: Left Pain Intervention(s): Medication (See eMAR)     See FIM for current functional status  Therapy/Group: Individual Therapy  Shenandoah Yeats 10/12/2014, 12:27 PM

## 2014-10-13 ENCOUNTER — Inpatient Hospital Stay (HOSPITAL_COMMUNITY): Payer: 59 | Admitting: Occupational Therapy

## 2014-10-13 ENCOUNTER — Inpatient Hospital Stay (HOSPITAL_COMMUNITY): Payer: 59

## 2014-10-13 DIAGNOSIS — F4321 Adjustment disorder with depressed mood: Secondary | ICD-10-CM | POA: Insufficient documentation

## 2014-10-13 NOTE — Progress Notes (Signed)
Occupational Therapy Discharge Summary  Patient Details  Name: [name] [name] MRN: [id number] Date of Birth: 10/07/1965  Today's Date: 10/13/2014  Patient has met 9 of 9 long term goals due to improved activity tolerance and ability to compensate for deficits.  Patient to discharge at overall Modified Independent level, supervision with shower transfers and setup to don leg brace while adhering to posterior hip precautions.  Patient's care partner is independent to provide the necessary physical assistance at discharge.    Reasons goals not met: N/A  Recommendation:  Patient will benefit from ongoing skilled OT services in home health setting to continue to advance functional skills in the area of BADL and Reduce care partner burden.  Equipment: BSC and shower chair  Reasons for discharge: treatment goals met and discharge from hospital  Patient/family agrees with progress made and goals achieved: Yes  OT Discharge Precautions/Restrictions  Precautions Precautions: Posterior Hip Required Braces or Orthoses: Knee Immobilizer - Left Knee Immobilizer - Left: On at all times Restrictions Weight Bearing Restrictions: Yes LLE Weight Bearing: Non weight bearing General   Vital Signs Therapy Vitals Temp: 99.1 F (37.3 C) Temp Source: Oral Pulse Rate: 99 Resp: 18 BP: 113/76 mmHg Patient Position (if appropriate): Lying Oxygen Therapy SpO2: 100 % O2 Device: Not Delivered Pain Pain Assessment Pain Assessment: 0-10 Pain Score: Asleep Pain Type: Acute pain Pain Location: Leg Pain Orientation: Left Pain Descriptors / Indicators: Aching Pain Frequency: Intermittent Pain Onset: With Activity Patients Stated Pain Goal: 4 Pain Intervention(s): Medication (See eMAR) ADL  See FIM Vision/Perception  Vision- History Baseline Vision/History: No visual deficits Patient Visual Report: No change from baseline Vision- Assessment Vision Assessment?: No apparent visual deficits   Cognition Overall Cognitive Status: Within Functional Limits for tasks assessed Arousal/Alertness: Awake/alert Orientation Level: Oriented X4 Attention: Alternating Alternating Attention: Appears intact Memory: Appears intact Awareness: Appears intact Safety/Judgment: Appears intact Sensation Sensation Light Touch: Appears Intact Stereognosis: Not tested Hot/Cold: Not tested Proprioception: Appears Intact Coordination Gross Motor Movements are Fluid and Coordinated: Yes Fine Motor Movements are Fluid and Coordinated: Yes Finger Nose Finger Test: [facility] Motor  Motor Motor: Within Functional Limits Mobility  Bed Mobility Supine to Sit: 6: Modified independent (Device/Increase time) Sit to Supine: 6: Modified independent (Device/Increase time) Transfers Sit to Stand: 6: Modified independent (Device/Increase time) Stand to Sit: 6: Modified independent (Device/Increase time)  Trunk/Postural Assessment  Cervical Assessment Cervical Assessment: Within Functional Limits Thoracic Assessment Thoracic Assessment: Within Functional Limits Lumbar Assessment Lumbar Assessment: Within Functional Limits Postural Control Postural Control: Within Functional Limits  Balance Static Sitting Balance Static Sitting - Level of Assistance: 6: Modified independent (Device/Increase time) Dynamic Sitting Balance Dynamic Sitting - Level of Assistance: 6: Modified independent (Device/Increase time) Static Standing Balance Static Standing - Level of Assistance: 6: Modified independent (Device/Increase time) Dynamic Standing Balance Dynamic Standing - Level of Assistance: 6: Modified independent (Device/Increase time) Extremity/Trunk Assessment RUE Assessment RUE Assessment: Within Functional Limits LUE Assessment LUE Assessment: Within Functional Limits  See FIM for current functional status  Lonita Debes, [facility] 10/13/2014, 3:11 PM

## 2014-10-13 NOTE — Progress Notes (Signed)
Occupational Therapy Session Note  Patient Details  Name: [name] [name] MRN: [id number] Date of Birth: 02/18/1966  Today's Date: 10/13/2014 OT Individual Time: 0730-0900 and 1300-1355 OT Individual Time Calculation (min): 90 min and 55 min   Short Term Goals: Week 1:  OT Short Term Goal 1 (Week 1): STG = LTGs due to ELOS  Skilled Therapeutic Interventions/Progress Updates:    1) Engaged in ADL retraining with focus on functional mobility, transfers, and use of AE to complete self-care tasks. Pt reports stiffness overnight due to increase in activity during therapy session yesterday, but willing to participate. Pt ambulated into room bathroom with RW at Mod I level, completed transfer to walk-in shower with backing up to tub bench and completing bathing from seated position with use of long handled sponge. Therapist assisted with donning and doffing leg brace, pt reports son will be available to assist with managing leg brace. Use of reacher with donning pants, able to don both socks this session with sock aid. Completed grooming tasks in standing at Mod I level. Engaged in simulated walk-in shower transfer with shower chair with recommendation to back up to shower stall and sit on seat then pivot into shower, pt return demonstrated transfer with RW and supervision. Engaged in BUE strengthening with 5# dowel rod with bicep curls and chest presses.  Performed furniture transfers with RW at Mod I level.  2) Engaged in therapeutic activity at w/c level with community mobility and transfers at overall mod I level.  In therapy gym, engaged in BUE strengthening activities with block pushups and education on theraband exercises with pt completing 3 reps of 10 with BUE.  Discussed d/c with pt reporting no concerns just wanting to continue to adhere to hip precautions with all mobility even sleeping.  Pt returned to room at end of session and completed bed mobility at Mod I level.  Therapy  Documentation Precautions:  Precautions Precautions: Posterior Hip Precaution Booklet Issued: Yes (comment) Precaution Comments: Reviewed posterior hip precautions and NWB>LLE Required Braces or Orthoses: Knee Immobilizer - Left Knee Immobilizer - Left: On at all times Restrictions Weight Bearing Restrictions: Yes LLE Weight Bearing: Non weight bearing Pain: Pain Assessment Pain Assessment: 0-10 Pain Score: 3  Pain Type: Acute pain;Surgical pain Pain Location: Leg Pain Orientation: Left Pain Descriptors / Indicators: Aching Pain Frequency: Intermittent Pain Onset: With Activity Patients Stated Pain Goal: 3 Pain Intervention(s): Cold applied;Relaxation;Repositioned Multiple Pain Sites: No  See FIM for current functional status  Therapy/Group: Individual Therapy  Rosalio LoudHOXIE, [name] 10/13/2014, 11:57 AM
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
## 2014-10-13 NOTE — Progress Notes (Signed)
Physical Therapy Session Note  Patient Details  Name: [name] HaneRichard A Sada MRN: [id number] Date of Birth: 01/28/1966  Today's Date: 10/13/2014 PT Individual Time: 1030-1130 PT Individual Time Calculation (min): 60 min   Short Term Goals: Week 1:  PT Short Term Goal 1 (Week 1): =LTG's due to ELOS  Skilled Therapeutic Interventions/Progress Updates:  Patient resting in bed upon entering room. Session focused on grad day activities. Patient demonstrated modified independence with car transfers, ambulation up and down ramp, w/c propulsion up and down ramp, and ambulation 200+ feet with RW in controlled and home like settings. Patient ambulated up and down 6 steps with right rail and left UE over shoulder of helper. Patient performed left LE active exercise including SAQ's, heel slides, hip abduction, and ankle dorsiflexion/plantarflexion. Pt on UEB x 10 minutes for UE strength and activity endurance. Patient made modified independent in the room. Family education needs to be completed with son on steps prior to discharge in am.  Therapy Documentation Precautions:  Precautions Precautions: Posterior Hip Precaution Booklet Issued: Yes (comment) Precaution Comments: Reviewed posterior hip precautions and NWB>LLE Required Braces or Orthoses: Knee Immobilizer - Left Knee Immobilizer - Left: On at all times Restrictions Weight Bearing Restrictions: Yes LLE Weight Bearing: Non weight bearing  Pain: Pain Assessment Pain Assessment: No/denies pain Pain Score: 3  Pain Type: Acute pain;Surgical pain Pain Location: Leg Pain Orientation: Left Pain Descriptors / Indicators: Aching Pain Frequency: Intermittent Pain Onset: With Activity Patients Stated Pain Goal: 3 Pain Intervention(s): Cold applied;Relaxation;Repositioned Multiple Pain Sites: No  Mobility: Bed Mobility Supine to Sit: 6: Modified independent (Device/Increase time) Sit to Supine: 6: Modified independent (Device/Increase  time) Transfers Transfers: Yes Sit to Stand: 6: Modified independent (Device/Increase time) Stand to Sit: 6: Modified independent (Device/Increase time) Stand Pivot Transfers: 6: Modified independent (Device/Increase time)  Locomotion : Ambulation Ambulation: Yes Ambulation/Gait Assistance: 6: Modified independent (Device/Increase time) Ambulation Distance (Feet): 200 Feet Assistive device: Rolling walker Gait Gait: Yes Stairs / Additional Locomotion Stairs: Yes Stairs Assistance: 4: Min assist Stairs Assistance Details (indicate cue type and reason): patient uses right rail and puts left arm over shoulder of helper Stair Management Technique: One rail Right Number of Stairs: 6 Height of Stairs: 6.5 Ramp: 6: Modified independent Surveyor, minerals(Device) Corporate treasurerWheelchair Mobility Wheelchair Mobility: Yes Wheelchair Assistance: 6: Modified independent (Device/Increase time) Occupational hygienistWheelchair Propulsion: Both upper extremities Wheelchair Parts Management: Independent Distance: 200  Trunk/Postural Assessment : Cervical Assessment Cervical Assessment: Within Functional Limits Thoracic Assessment Thoracic Assessment: Within Functional Limits Lumbar Assessment Lumbar Assessment: Within Functional Limits Postural Control Postural Control: Within Functional Limits   Balance: Static Sitting Balance Static Sitting - Level of Assistance: 6: Modified independent (Device/Increase time) Dynamic Sitting Balance Dynamic Sitting - Level of Assistance: 6: Modified independent (Device/Increase time) Static Standing Balance Static Standing - Level of Assistance: 6: Modified independent (Device/Increase time) Dynamic Standing Balance Dynamic Standing - Level of Assistance: 6: Modified independent (Device/Increase time)  See FIM for current functional status  Therapy/Group: Individual Therapy  Alma FriendlyWindsor, Glenice Ciccone M 10/13/2014, 1:16 PM

## 2014-10-13 NOTE — Discharge Summary (Signed)
Discharge summary job [id number]
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
## 2014-10-13 NOTE — Plan of Care (Signed)
Problem: RH Dressing Goal: LTG Patient will perform lower body dressing w/assist (OT) LTG: Patient will perform lower body dressing with assist, with/without cues in positioning using equipment (OT)  Downgraded due to requiring assist to set up/don leg brace while adhering to posterior hip precautions

## 2014-10-14 ENCOUNTER — Ambulatory Visit (HOSPITAL_COMMUNITY): Payer: Managed Care, Other (non HMO) | Admitting: Rehabilitation

## 2014-10-14 LAB — CREATININE, SERUM
Creatinine, Ser: 0.7 mg/dL (ref 0.61–1.24)
GFR calc Af Amer: >60 mL/min (ref >60)

## 2014-10-14 MED ORDER — POLYETHYLENE GLYCOL 3350 17 G PO PACK: 17.0000 g | PACK | Freq: Every day | ORAL | Status: DC

## 2014-10-14 MED ORDER — DOCUSATE SODIUM 100 MG PO CAPS: 100.0000 mg | ORAL_CAPSULE | Freq: Two times a day (BID) | ORAL | Status: DC

## 2014-10-14 MED ORDER — ASCORBIC ACID 500 MG PO TABS: 500.0000 mg | ORAL_TABLET | Freq: Two times a day (BID) | ORAL | Status: DC

## 2014-10-14 MED ORDER — OXYCODONE HCL 10 MG PO TABS: 10.0000 mg | ORAL_TABLET | ORAL | Status: DC | PRN

## 2014-10-14 MED ORDER — METHOCARBAMOL 500 MG PO TABS: 500.0000 mg | ORAL_TABLET | Freq: Four times a day (QID) | ORAL | Status: DC | PRN

## 2014-10-14 MED ORDER — VITAMIN D3 25 MCG (1000 UNIT) PO TABS: 1000.0000 [IU] | ORAL_TABLET | Freq: Two times a day (BID) | ORAL | Status: DC
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
## 2014-10-14 NOTE — Progress Notes (Signed)
Patient discharged at this time via wheelchair, escorted by nursing staff and family. Patient verbalized understanding of discharge instructions as given by Deatra Inaan Angiulli PA. All patient belongings including DME sent with patient. Patient was in no immediate distress. Sydell Axonelista L Mindel Friscia, RN

## 2014-10-14 NOTE — Progress Notes (Signed)
Physical Therapy Discharge Summary  Patient Details  Name: [name] [name] MRN: [id number] Date of Birth: 23-Jan-1966  Today's Date: 10/14/2014 PT Individual Time: 1030-1100 PT Individual Time Calculation (min): 30 min    Patient has met 8 of 8 long term goals due to improved activity tolerance, improved balance, increased range of motion, decreased pain and ability to compensate for deficits.  Patient to discharge at an ambulatory level Modified Independent.   Patient's care partner is independent to provide the necessary physical assistance at discharge.  Reasons goals not met: n/a  Recommendation:  Patient will benefit from ongoing skilled PT services in home health setting once able to WB to continue to advance safe functional mobility, address ongoing impairments in balance, functional mobility, LLE strength, and minimize fall risk.  Equipment: w/c, cushion, RW  Reasons for discharge: treatment goals met and discharge from hospital  Patient/family agrees with progress made and goals achieved: Yes   PT Treatment/intervention:  Pt received lying in bed, agreeable to short session for family education on w/c management as well as stair training for safe D/C home.  Performed all bed mobility, transfers and w/c propulsion at mod I level with use of RW.  Educated pt and son on how to fold w/c up/down, management of leg rests, and finally how to perform stairs with R rail and son being under L axilla. Pt did very well cuing son on how to assist with cues also for body mechanics and positioning for safety.  Educated on how to get to gift shop in order to purchase hip kit.  Pt left in hallway with PA to give D/C instructions.    PT Discharge Precautions/Restrictions Precautions Precautions: Posterior Hip Required Braces or Orthoses: Knee Immobilizer - Left Knee Immobilizer - Left: On at all times Restrictions Weight Bearing Restrictions: Yes LLE Weight Bearing: Non weight bearing Vital  Signs Therapy Vitals Temp: 97.8 F (36.6 C) Temp Source: Oral Pulse Rate: 99 Resp: 18 BP: 100/62 mmHg Patient Position (if appropriate): Lying Oxygen Therapy SpO2: 98 % O2 Device: Not Delivered Pain Pain Assessment Pain Assessment: 0-10 Pain Score: 7  Pain Type: Acute pain Pain Location: Knee Pain Orientation: Left Pain Descriptors / Indicators: Aching Pain Frequency: Constant Pain Onset: On-going Patients Stated Pain Goal: 3 Pain Intervention(s): Medication (See eMAR)    Cognition Arousal/Alertness: Awake/alert Orientation Level: Oriented X4 Attention: Alternating Alternating Attention: Appears intact Memory: Appears intact Awareness: Appears intact Safety/Judgment: Appears intact Sensation Sensation Light Touch: Appears Intact Stereognosis: Not tested Hot/Cold: Not tested Proprioception: Appears Intact Coordination Gross Motor Movements are Fluid and Coordinated: Yes Fine Motor Movements are Fluid and Coordinated: Yes Motor  Motor Motor: Within Functional Limits  Mobility Bed Mobility Supine to Sit: 6: Modified independent (Device/Increase time) Sit to Supine: 6: Modified independent (Device/Increase time) Transfers Transfers: Yes Sit to Stand: 6: Modified independent (Device/Increase time) Stand to Sit: 6: Modified independent (Device/Increase time) Stand Pivot Transfers: 6: Modified independent (Device/Increase time) Locomotion  Ambulation Ambulation: Yes Ambulation/Gait Assistance: 6: Modified independent (Device/Increase time) Assistive device: Rolling walker Gait Gait: Yes Stairs / Additional Locomotion Stairs: Yes Stairs Assistance: 4: Min assist Stair Management Technique: One rail Right Height of Stairs: 6.5 Ramp: 6: Modified independent Water quality scientist) Product manager Mobility: Yes Wheelchair Assistance: 6: Modified independent (Device/Increase time) Environmental health practitioner: Both upper extremities Wheelchair Parts Management:  Independent  Trunk/Postural Assessment  Cervical Assessment Cervical Assessment: Within Functional Limits Thoracic Assessment Thoracic Assessment: Within Functional Limits Lumbar Assessment Lumbar Assessment: Within Functional Limits Postural Control  Postural Control: Within Functional Limits  Balance Static Sitting Balance Static Sitting - Level of Assistance: 6: Modified independent (Device/Increase time) Dynamic Sitting Balance Dynamic Sitting - Level of Assistance: 6: Modified independent (Device/Increase time) Static Standing Balance Static Standing - Level of Assistance: 6: Modified independent (Device/Increase time) Dynamic Standing Balance Dynamic Standing - Level of Assistance: 6: Modified independent (Device/Increase time) Extremity Assessment      RLE Assessment RLE Assessment: Within Functional Limits LLE Assessment LLE Assessment: Exceptions to [facility] LLE Strength LLE Overall Strength: Deficits;Due to precautions LLE Overall Strength Comments: active movement at hip, knee and ankle; resistance not given due to precautions; knee ROM limited by brace  See FIM for current functional status  Denice Bors 10/14/2014, 8:30 AM

## 2014-10-14 NOTE — Discharge Instructions (Signed)
Inpatient Rehab Discharge Instructions  [name] [name] Discharge date and time: No discharge date for patient encounter.   Activities/Precautions/ Functional Status: Activity: Nonweightbearing left lower extremity with hinged knee brace posterior hip precautions Diet: regular diet Wound Care: keep wound clean and dry Functional status:  ___ No restrictions     ___ Walk up steps independently ___ 24/7 supervision/assistance   ___ Walk up steps with assistance ___ Intermittent supervision/assistance  ___ Bathe/dress independently ___ Walk with walker     ___ Bathe/dress with assistance ___ Walk Independently    ___ Shower independently _x__ Walk with assistance    ___ Shower with assistance ___ No alcohol     ___ Return to work/school ________  COMMUNITY REFERRALS UPON DISCHARGE:   Home Health:   PT     OT     RN  Ag[facility] Phone:  [phone] Medical Equipment/Items Ordered:  16x16 lightweight wheelchair with basic cushion; rolling walker; shower seat; bedside commode  Agency/Supplier:  [facility]      Phone:  [phone]  Special Instructions:    My questions have been answered and I understand these instructions. I will adhere to these goals and the provided educational materials after my discharge from the hospital.  Patient/Caregiver Signature _______________________________ Date __________  Clinician Signature _______________________________________ Date __________  Please bring this form and your medication list with you to all your follow-up doctor's appointments.

## 2014-10-14 NOTE — Progress Notes (Signed)
Social Work Discharge Note  The overall goal for the admission was met for:   Discharge location: Yes - home with son and son's girlfriend  Length of Stay: Yes - 7 days  Discharge activity level: Yes - modified independent  Home/community participation: Yes   Services provided included: MD, RD, PT, OT, RN, Pharmacy, Neuropsych and SW  Financial Services: Private Insurance: Theme park manager  Follow-up services arranged: Home Health: PT/OT/RN from [facility] DME: 16x16 lightweight w/c with basic cushion; bedside commode; rolling walker; shower seat from [place] and Patient/Family has no preference for HH/DME agencies  Comments (or additional information):  Pt to return to his home he shares with his son and son's girlfriend.  Friends/family are building a ramp for him and son can transport pt to MD appointments.  Pt will be able to manage his care.  Offered pt substance abuse resources, but pt stated he has already gone through that in the past and did not feel he needed anything at this time.  Patient/Family verbalized understanding of follow-up arrangements: Yes  Individual responsible for coordination of the follow-up plan: pt with support from his son  Confirmed correct DME delivered: Trey Sailors 10/14/2014    Quade Ritchey, Silvestre Mesi

## 2014-10-14 NOTE — Progress Notes (Signed)
Patient requested belongings sent to security safe when admitted.  Retrieved patients envelope from security and released materials to patient.  Patient signed envelope, along with nurse stating all materials were accounted for, empty envelope placed in chart.  Dani Gobbleeardon, Brigg Cape J, RN

## 2014-10-14 NOTE — Progress Notes (Signed)
Social Work Patient ID: [name] [name], male   DOB: 1965/12/02, 49 y.o.   MRN: [id number]   CSW met with pt who is ready to be d/c'd.  His son came today to learn how to bump the w/c up until the ramp is built. CSW gave pt the fax CSW sent to his short term disability company on his behalf.  PA, Marlowe Shores, completed paperwork for pt.  Told pt that team feels he has met all goals and is ready for d/c.  [name].  [place] f/u set up and DME ordered and delivered to room.  No other concerns/questions/needs, but pt encouraged to call CSW should needs arise.  He was appreciative.

## 2014-10-16 ENCOUNTER — Encounter (HOSPITAL_COMMUNITY): Payer: Self-pay | Admitting: Orthopedic Surgery

## 2014-10-20 ENCOUNTER — Telehealth: Payer: Self-pay | Admitting: *Deleted

## 2014-10-20 NOTE — Telephone Encounter (Signed)
[facility] called to say that OT has evaluated Mr [name] [name] and he does not have any needs at this time.

## 2014-10-30 NOTE — Progress Notes (Signed)
  Radiation Oncology         [phone] ________________________________  Name: [name] [name] MRN: [id number]  Date: 10/05/2014  DOB: 05/24/1966  End of Treatment Note  Diagnosis:   Postoperative heterotopic ossification     Indication for treatment:  curative       Radiation treatment dates:   10/05/2014  Site/dose:   The patient was treated to a dose of 7 Gy to the left hip. This was accomplished with AP and PA fields.  Narrative: The patient tolerated radiation treatment relatively well.   No difficulties.  Plan: The patient has completed radiation treatment. The patient will return to radiation oncology clinic on an as needed basis. ________________________________  Radene GunningJohn S. Anetria Harwick, M.D., Ph.D.

## 2014-11-11 ENCOUNTER — Encounter (HOSPITAL_COMMUNITY): Payer: Self-pay | Admitting: *Deleted
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
## 2014-11-11 NOTE — Progress Notes (Signed)
Pt denies SOB, chest pain, and being under the care of a cardiologist. Pt denies having a stress test, echo and cardiac cath. Pt made aware to stop taking Aspirin, otc vitamins and herbal medications. Do not take any NSAIDs ie: Ibuprofen, Advil, Naproxen or any medication containing Aspirin. Pt verbalized understanding of all pre-op instructions. 

## 2014-11-12 ENCOUNTER — Inpatient Hospital Stay (HOSPITAL_COMMUNITY): Payer: 59 | Admitting: Anesthesiology

## 2014-11-12 ENCOUNTER — Observation Stay (HOSPITAL_COMMUNITY): Payer: 59

## 2014-11-12 ENCOUNTER — Encounter (HOSPITAL_COMMUNITY)
Admission: RE | Disposition: A | Discharge status: Home / Self Care | Payer: Self-pay | Source: Ambulatory Visit | Attending: Orthopedic Surgery

## 2014-11-12 ENCOUNTER — Inpatient Hospital Stay (HOSPITAL_COMMUNITY): Payer: 59

## 2014-11-12 ENCOUNTER — Encounter (HOSPITAL_COMMUNITY): Payer: Self-pay | Admitting: *Deleted

## 2014-11-12 ENCOUNTER — Observation Stay (HOSPITAL_COMMUNITY)
Admission: RE | Admit: 2014-11-12 | Discharge: 2014-11-13 | Disposition: A | Discharge status: Home / Self Care | Payer: 59 | Source: Ambulatory Visit | Attending: Orthopedic Surgery | Admitting: Orthopedic Surgery

## 2014-11-12 DIAGNOSIS — E349 Endocrine disorder, unspecified: Secondary | ICD-10-CM | POA: Diagnosis present

## 2014-11-12 DIAGNOSIS — S72452B Displaced supracondylar fracture without intracondylar extension of lower end of left femur, initial encounter for open fracture type I or II: Secondary | ICD-10-CM | POA: Diagnosis present

## 2014-11-12 DIAGNOSIS — S72453K Displaced supracondylar fracture without intracondylar extension of lower end of unspecified femur, subsequent encounter for closed fracture with nonunion: Secondary | ICD-10-CM | POA: Diagnosis present

## 2014-11-12 DIAGNOSIS — S72492K Other fracture of lower end of left femur, subsequent encounter for closed fracture with nonunion: Principal | ICD-10-CM | POA: Insufficient documentation

## 2014-11-12 DIAGNOSIS — Z419 Encounter for procedure for purposes other than remedying health state, unspecified: Secondary | ICD-10-CM

## 2014-11-12 DIAGNOSIS — E559 Vitamin D deficiency, unspecified: Secondary | ICD-10-CM | POA: Diagnosis not present

## 2014-11-12 DIAGNOSIS — M24562 Contracture, left knee: Secondary | ICD-10-CM | POA: Diagnosis not present

## 2014-11-12 DIAGNOSIS — D62 Acute posthemorrhagic anemia: Secondary | ICD-10-CM | POA: Diagnosis not present

## 2014-11-12 DIAGNOSIS — M24569 Contracture, unspecified knee: Secondary | ICD-10-CM | POA: Diagnosis present

## 2014-11-12 DIAGNOSIS — S72402K Unspecified fracture of lower end of left femur, subsequent encounter for closed fracture with nonunion: Secondary | ICD-10-CM

## 2014-11-12 HISTORY — PX: ORIF FEMUR FRACTURE: SHX2119

## 2014-11-12 HISTORY — PX: CAPSULOTOMY: SHX379

## 2014-11-12 HISTORY — PX: EXCISIONAL TOTAL HIP ARTHROPLASTY WITH ANTIBIOTIC SPACERS: SHX5826

## 2014-11-12 HISTORY — DX: Endocrine disorder, unspecified: E34.9

## 2014-11-12 HISTORY — DX: Contracture, unspecified knee: M24.569

## 2014-11-12 LAB — CBC WITH DIFFERENTIAL/PLATELET
Basophils Absolute: 0 10*3/uL (ref 0.0–0.1)
Basophils Relative: 1 % (ref 0–1)
EOS ABS: 0.8 10*3/uL — AB (ref 0.0–0.7)
Eosinophils Relative: 11 % — ABNORMAL HIGH (ref 0–5)
HCT: 42.1 % (ref 39.0–52.0)
HEMOGLOBIN: 14.1 g/dL (ref 13.0–17.0)
LYMPHS ABS: 2.8 10*3/uL (ref 0.7–4.0)
LYMPHS PCT: 35 % (ref 12–46)
MCH: 31.2 pg (ref 26.0–34.0)
MCHC: 33.5 g/dL (ref 30.0–36.0)
MCV: 93.1 fL (ref 78.0–100.0)
MONOS PCT: 8 % (ref 3–12)
Monocytes Absolute: 0.7 10*3/uL (ref 0.1–1.0)
NEUTROS PCT: 45 % (ref 43–77)
Neutro Abs: 3.7 10*3/uL (ref 1.7–7.7)
PLATELETS: 333 10*3/uL (ref 150–400)
RBC: 4.52 MIL/uL (ref 4.22–5.81)
RDW: 15.1 % (ref 11.5–15.5)
WBC: 8 10*3/uL (ref 4.0–10.5)

## 2014-11-12 LAB — COMPREHENSIVE METABOLIC PANEL
ALK PHOS: 76 U/L (ref 38–126)
ALT: 16 U/L — AB (ref 17–63)
AST: 104 U/L — ABNORMAL HIGH (ref 15–41)
Albumin: 4.1 g/dL (ref 3.5–5.0)
Anion gap: 12 (ref 5–15)
BILIRUBIN TOTAL: 0.5 mg/dL (ref 0.3–1.2)
BUN: 6 mg/dL (ref 6–20)
CO2: 25 mmol/L (ref 22–32)
CREATININE: 0.57 mg/dL — AB (ref 0.61–1.24)
Calcium: 9.4 mg/dL (ref 8.9–10.3)
Chloride: 100 mmol/L — ABNORMAL LOW (ref 101–111)
GFR calc Af Amer: >60 mL/min (ref >60)
GFR calc non Af Amer: >60 mL/min (ref >60)
Glucose, Bld: 90 mg/dL (ref 65–99)
Potassium: 3.9 mmol/L (ref 3.5–5.1)
Sodium: 137 mmol/L (ref 135–145)
Total Protein: 7.6 g/dL (ref 6.5–8.1)

## 2014-11-12 SURGERY — OPEN REDUCTION INTERNAL FIXATION (ORIF) DISTAL FEMUR FRACTURE
Anesthesia: General | Site: Knee | Laterality: Left

## 2014-11-12 MED ORDER — DOCUSATE SODIUM 100 MG PO CAPS
100.0000 mg | ORAL_CAPSULE | Freq: Two times a day (BID) | ORAL | Status: DC
  Administered 2014-11-12 – 2014-11-13 (×3): 100 mg via ORAL
  Filled 2014-11-12 (×3): qty 1

## 2014-11-12 MED ORDER — ONDANSETRON HCL 4 MG PO TABS: 4.0000 mg | ORAL_TABLET | Freq: Four times a day (QID) | ORAL | Status: DC | PRN

## 2014-11-12 MED ORDER — OXYCODONE HCL 5 MG PO TABS
ORAL_TABLET | ORAL | Status: AC
  Filled 2014-11-12: qty 1

## 2014-11-12 MED ORDER — FENTANYL CITRATE (PF) 100 MCG/2ML IJ SOLN
INTRAMUSCULAR | Status: DC | PRN
  Administered 2014-11-12 (×2): 50 ug via INTRAVENOUS
  Administered 2014-11-12: 150 ug via INTRAVENOUS
  Administered 2014-11-12: 50 ug via INTRAVENOUS
  Administered 2014-11-12: 100 ug via INTRAVENOUS

## 2014-11-12 MED ORDER — HYDROMORPHONE HCL 1 MG/ML IJ SOLN
INTRAMUSCULAR | Status: AC
  Filled 2014-11-12: qty 1

## 2014-11-12 MED ORDER — METOCLOPRAMIDE HCL 5 MG/ML IJ SOLN: 5.0000 mg | Freq: Three times a day (TID) | INTRAMUSCULAR | Status: DC | PRN

## 2014-11-12 MED ORDER — HYDROMORPHONE HCL 1 MG/ML IJ SOLN
0.2500 mg | INTRAMUSCULAR | Status: DC | PRN
  Administered 2014-11-12 (×4): 0.5 mg via INTRAVENOUS

## 2014-11-12 MED ORDER — GLYCOPYRROLATE 0.2 MG/ML IJ SOLN
INTRAMUSCULAR | Status: AC
  Filled 2014-11-12: qty 4

## 2014-11-12 MED ORDER — NEOSTIGMINE METHYLSULFATE 10 MG/10ML IV SOLN
INTRAVENOUS | Status: DC | PRN
  Administered 2014-11-12: 3 mg via INTRAVENOUS

## 2014-11-12 MED ORDER — ROCURONIUM BROMIDE 50 MG/5ML IV SOLN
INTRAVENOUS | Status: AC
  Filled 2014-11-12: qty 2

## 2014-11-12 MED ORDER — KETOROLAC TROMETHAMINE 15 MG/ML IJ SOLN
15.0000 mg | Freq: Four times a day (QID) | INTRAMUSCULAR | Status: AC
  Administered 2014-11-12 – 2014-11-13 (×4): 15 mg via INTRAVENOUS
  Filled 2014-11-12 (×3): qty 1

## 2014-11-12 MED ORDER — MIDAZOLAM HCL 2 MG/2ML IJ SOLN
INTRAMUSCULAR | Status: AC
  Filled 2014-11-12: qty 2

## 2014-11-12 MED ORDER — OXYCODONE HCL 5 MG PO TABS
5.0000 mg | ORAL_TABLET | Freq: Once | ORAL | Status: AC | PRN
  Administered 2014-11-12: 5 mg via ORAL

## 2014-11-12 MED ORDER — EPHEDRINE SULFATE 50 MG/ML IJ SOLN
INTRAMUSCULAR | Status: AC
  Filled 2014-11-12: qty 1

## 2014-11-12 MED ORDER — MENTHOL 3 MG MT LOZG: 1.0000 | LOZENGE | OROMUCOSAL | Status: DC | PRN

## 2014-11-12 MED ORDER — CEFAZOLIN SODIUM 1-5 GM-% IV SOLN
1.0000 g | Freq: Four times a day (QID) | INTRAVENOUS | Status: AC
  Administered 2014-11-12 (×2): 1 g via INTRAVENOUS
  Filled 2014-11-12 (×3): qty 50

## 2014-11-12 MED ORDER — VITAMIN D3 25 MCG (1000 UNIT) PO TABS
1000.0000 [IU] | ORAL_TABLET | Freq: Two times a day (BID) | ORAL | Status: DC
  Administered 2014-11-12 – 2014-11-13 (×3): 1000 [IU] via ORAL
  Filled 2014-11-12 (×6): qty 1

## 2014-11-12 MED ORDER — FENTANYL CITRATE (PF) 250 MCG/5ML IJ SOLN
INTRAMUSCULAR | Status: AC
  Filled 2014-11-12: qty 5

## 2014-11-12 MED ORDER — METOCLOPRAMIDE HCL 5 MG PO TABS: 5.0000 mg | ORAL_TABLET | Freq: Three times a day (TID) | ORAL | Status: DC | PRN

## 2014-11-12 MED ORDER — OXYCODONE-ACETAMINOPHEN 10-325 MG PO TABS: 1.0000 | ORAL_TABLET | Freq: Four times a day (QID) | ORAL | Status: DC | PRN

## 2014-11-12 MED ORDER — LIDOCAINE-EPINEPHRINE (PF) 1.5 %-1:200000 IJ SOLN
INTRAMUSCULAR | Status: DC | PRN
Start: 2014-11-12 — End: 2014-11-12
  Administered 2014-11-12: 3 mL via PERINEURAL

## 2014-11-12 MED ORDER — ACETAMINOPHEN 325 MG PO TABS
650.0000 mg | ORAL_TABLET | Freq: Four times a day (QID) | ORAL | Status: DC | PRN
  Administered 2014-11-13: 650 mg via ORAL
  Filled 2014-11-12: qty 2

## 2014-11-12 MED ORDER — PHENOL 1.4 % MT LIQD: 1.0000 | OROMUCOSAL | Status: DC | PRN

## 2014-11-12 MED ORDER — ACETAMINOPHEN 650 MG RE SUPP: 650.0000 mg | Freq: Four times a day (QID) | RECTAL | Status: DC | PRN

## 2014-11-12 MED ORDER — PROPOFOL 10 MG/ML IV BOLUS
INTRAVENOUS | Status: AC
  Filled 2014-11-12: qty 20

## 2014-11-12 MED ORDER — KETOROLAC TROMETHAMINE 15 MG/ML IJ SOLN
INTRAMUSCULAR | Status: AC
  Filled 2014-11-12: qty 1

## 2014-11-12 MED ORDER — ONDANSETRON HCL 4 MG/2ML IJ SOLN
INTRAMUSCULAR | Status: DC | PRN
  Administered 2014-11-12: 4 mg via INTRAVENOUS

## 2014-11-12 MED ORDER — OXYCODONE HCL 5 MG PO TABS: 5.0000 mg | ORAL_TABLET | Freq: Four times a day (QID) | ORAL | Status: DC | PRN

## 2014-11-12 MED ORDER — GLYCOPYRROLATE 0.2 MG/ML IJ SOLN
INTRAMUSCULAR | Status: DC | PRN
  Administered 2014-11-12: 0.6 mg via INTRAVENOUS

## 2014-11-12 MED ORDER — ROCURONIUM BROMIDE 100 MG/10ML IV SOLN
INTRAVENOUS | Status: DC | PRN
  Administered 2014-11-12: 50 mg via INTRAVENOUS
  Administered 2014-11-12: 10 mg via INTRAVENOUS

## 2014-11-12 MED ORDER — CEFAZOLIN SODIUM-DEXTROSE 2-3 GM-% IV SOLR
INTRAVENOUS | Status: AC
  Administered 2014-11-12: 2 g via INTRAVENOUS
  Filled 2014-11-12: qty 50

## 2014-11-12 MED ORDER — LACTATED RINGERS IV SOLN
INTRAVENOUS | Status: DC
  Administered 2014-11-12 – 2014-11-13 (×2): via INTRAVENOUS

## 2014-11-12 MED ORDER — HYDROMORPHONE HCL 1 MG/ML IJ SOLN
0.2500 mg | INTRAMUSCULAR | Status: DC | PRN
  Administered 2014-11-12 (×2): 0.5 mg via INTRAVENOUS

## 2014-11-12 MED ORDER — OXYCODONE-ACETAMINOPHEN 5-325 MG PO TABS
1.0000 | ORAL_TABLET | Freq: Four times a day (QID) | ORAL | Status: DC | PRN
  Administered 2014-11-12 – 2014-11-13 (×4): 2 via ORAL
  Filled 2014-11-12 (×4): qty 2

## 2014-11-12 MED ORDER — ROPIVACAINE HCL 5 MG/ML IJ SOLN
INTRAMUSCULAR | Status: DC | PRN
  Administered 2014-11-12: 20 mL via PERINEURAL

## 2014-11-12 MED ORDER — ONDANSETRON HCL 4 MG/2ML IJ SOLN
INTRAMUSCULAR | Status: AC
  Filled 2014-11-12: qty 4

## 2014-11-12 MED ORDER — SODIUM CHLORIDE 0.9 % IJ SOLN
INTRAMUSCULAR | Status: AC
  Filled 2014-11-12: qty 10

## 2014-11-12 MED ORDER — LACTATED RINGERS IV SOLN
INTRAVENOUS | Status: DC | PRN
  Administered 2014-11-12 (×2): via INTRAVENOUS

## 2014-11-12 MED ORDER — PROPOFOL 10 MG/ML IV BOLUS
INTRAVENOUS | Status: DC | PRN
  Administered 2014-11-12: 180 mg via INTRAVENOUS
  Administered 2014-11-12: 50 mg via INTRAVENOUS

## 2014-11-12 MED ORDER — ONDANSETRON HCL 4 MG/2ML IJ SOLN: 4.0000 mg | Freq: Four times a day (QID) | INTRAMUSCULAR | Status: DC | PRN

## 2014-11-12 MED ORDER — ACETAMINOPHEN 160 MG/5ML PO SOLN
325.0000 mg | ORAL | Status: DC | PRN
  Filled 2014-11-12: qty 20.3

## 2014-11-12 MED ORDER — ROPIVACAINE HCL 2 MG/ML IJ SOLN
12.0000 mL/h | INTRAMUSCULAR | Status: DC
  Administered 2014-11-12 – 2014-11-13 (×2): 12 mL/h
  Filled 2014-11-12 (×4): qty 200

## 2014-11-12 MED ORDER — NEOSTIGMINE METHYLSULFATE 10 MG/10ML IV SOLN
INTRAVENOUS | Status: AC
  Filled 2014-11-12: qty 1

## 2014-11-12 MED ORDER — DIPHENHYDRAMINE HCL 12.5 MG/5ML PO ELIX: 12.5000 mg | ORAL_SOLUTION | ORAL | Status: DC | PRN

## 2014-11-12 MED ORDER — HYDROMORPHONE HCL 1 MG/ML IJ SOLN
0.5000 mg | INTRAMUSCULAR | Status: DC | PRN
  Administered 2014-11-12: 1 mg via INTRAVENOUS
  Filled 2014-11-12: qty 1

## 2014-11-12 MED ORDER — METHOCARBAMOL 500 MG PO TABS
ORAL_TABLET | ORAL | Status: AC
  Filled 2014-11-12: qty 1

## 2014-11-12 MED ORDER — 0.9 % SODIUM CHLORIDE (POUR BTL) OPTIME
TOPICAL | Status: DC | PRN
  Administered 2014-11-12: 1000 mL

## 2014-11-12 MED ORDER — SUCCINYLCHOLINE CHLORIDE 20 MG/ML IJ SOLN
INTRAMUSCULAR | Status: AC
  Filled 2014-11-12: qty 1

## 2014-11-12 MED ORDER — OXYCODONE HCL 5 MG/5ML PO SOLN: 5.0000 mg | Freq: Once | ORAL | Status: AC | PRN

## 2014-11-12 MED ORDER — LIDOCAINE HCL (CARDIAC) 20 MG/ML IV SOLN
INTRAVENOUS | Status: AC
  Filled 2014-11-12: qty 5

## 2014-11-12 MED ORDER — LIDOCAINE HCL (CARDIAC) 20 MG/ML IV SOLN
INTRAVENOUS | Status: DC | PRN
  Administered 2014-11-12: 70 mg via INTRAVENOUS

## 2014-11-12 MED ORDER — MIDAZOLAM HCL 5 MG/5ML IJ SOLN
INTRAMUSCULAR | Status: DC | PRN
  Administered 2014-11-12: 2 mg via INTRAVENOUS

## 2014-11-12 MED ORDER — ACETAMINOPHEN 325 MG PO TABS: 325.0000 mg | ORAL_TABLET | ORAL | Status: DC | PRN

## 2014-11-12 MED ORDER — METHOCARBAMOL 500 MG PO TABS
500.0000 mg | ORAL_TABLET | Freq: Four times a day (QID) | ORAL | Status: DC | PRN
  Administered 2014-11-12 – 2014-11-13 (×3): 500 mg via ORAL
  Filled 2014-11-12 (×2): qty 1

## 2014-11-12 SURGICAL SUPPLY — 79 items
BANDAGE ELASTIC 4 VELCRO ST LF (GAUZE/BANDAGES/DRESSINGS) ×3 IMPLANT
BANDAGE ELASTIC 6 VELCRO ST LF (GAUZE/BANDAGES/DRESSINGS) ×3 IMPLANT
BANDAGE ESMARK 6X9 LF (GAUZE/BANDAGES/DRESSINGS) ×1 IMPLANT
BLADE SURG 10 STRL SS (BLADE) ×3 IMPLANT
BLADE SURG ROTATE 9660 (MISCELLANEOUS) IMPLANT
BNDG CMPR 9X6 STRL LF SNTH (GAUZE/BANDAGES/DRESSINGS) ×1
BNDG COHESIVE 4X5 TAN STRL (GAUZE/BANDAGES/DRESSINGS) ×3 IMPLANT
BNDG ESMARK 6X9 LF (GAUZE/BANDAGES/DRESSINGS) ×3
BNDG GAUZE ELAST 4 BULKY (GAUZE/BANDAGES/DRESSINGS) ×4 IMPLANT
BONE CANC CHIPS 40CC CAN1/2 (Bone Implant) ×3 IMPLANT
BRUSH SCRUB DISP (MISCELLANEOUS) ×6 IMPLANT
CANISTER SUCT 3000ML PPV (MISCELLANEOUS) ×3 IMPLANT
CHIPS CANC BONE 40CC CAN1/2 (Bone Implant) ×1 IMPLANT
COVER MAYO STAND STRL (DRAPES) ×3 IMPLANT
COVER SURGICAL LIGHT HANDLE (MISCELLANEOUS) ×6 IMPLANT
DRAPE C-ARM 42X72 X-RAY (DRAPES) ×3 IMPLANT
DRAPE C-ARMOR (DRAPES) ×3 IMPLANT
DRAPE IMP U-DRAPE 54X76 (DRAPES) ×3 IMPLANT
DRAPE OEC MINIVIEW 54X84 (DRAPES) ×3 IMPLANT
DRAPE ORTHO SPLIT 77X108 STRL (DRAPES) ×9
DRAPE SURG ORHT 6 SPLT 77X108 (DRAPES) ×3 IMPLANT
DRAPE U-SHAPE 47X51 STRL (DRAPES) ×3 IMPLANT
DRSG ADAPTIC 3X8 NADH LF (GAUZE/BANDAGES/DRESSINGS) ×3 IMPLANT
DRSG EMULSION OIL 3X3 NADH (GAUZE/BANDAGES/DRESSINGS) IMPLANT
DRSG PAD ABDOMINAL 8X10 ST (GAUZE/BANDAGES/DRESSINGS) ×6 IMPLANT
ELECT REM PT RETURN 9FT ADLT (ELECTROSURGICAL) ×3
ELECTRODE REM PT RTRN 9FT ADLT (ELECTROSURGICAL) ×1 IMPLANT
EVACUATOR 1/8 PVC DRAIN (DRAIN) IMPLANT
EVACUATOR 3/16  PVC DRAIN (DRAIN)
EVACUATOR 3/16 PVC DRAIN (DRAIN) IMPLANT
GAUZE SPONGE 4X4 12PLY STRL (GAUZE/BANDAGES/DRESSINGS) ×3 IMPLANT
GAUZE XEROFORM 1X8 LF (GAUZE/BANDAGES/DRESSINGS) IMPLANT
GLOVE BIO SURGEON STRL SZ7.5 (GLOVE) ×3 IMPLANT
GLOVE BIO SURGEON STRL SZ8 (GLOVE) ×3 IMPLANT
GLOVE BIOGEL PI IND STRL 7.5 (GLOVE) ×1 IMPLANT
GLOVE BIOGEL PI IND STRL 8 (GLOVE) ×1 IMPLANT
GLOVE BIOGEL PI INDICATOR 7.5 (GLOVE) ×2
GLOVE BIOGEL PI INDICATOR 8 (GLOVE) ×2
GOWN STRL REUS W/ TWL LRG LVL3 (GOWN DISPOSABLE) ×2 IMPLANT
GOWN STRL REUS W/ TWL XL LVL3 (GOWN DISPOSABLE) ×1 IMPLANT
GOWN STRL REUS W/TWL LRG LVL3 (GOWN DISPOSABLE) ×6
GOWN STRL REUS W/TWL XL LVL3 (GOWN DISPOSABLE) ×3
GRAFT BNE CHIP CANC 1-8 40 (Bone Implant) IMPLANT
KIT BASIN OR (CUSTOM PROCEDURE TRAY) ×3 IMPLANT
KIT ROOM TURNOVER OR (KITS) ×3 IMPLANT
MANIFOLD NEPTUNE II (INSTRUMENTS) ×3 IMPLANT
NDL HYPO 21X1.5 SAFETY (NEEDLE) IMPLANT
NEEDLE 22X1 1/2 (OR ONLY) (NEEDLE) IMPLANT
NEEDLE HYPO 21X1.5 SAFETY (NEEDLE) IMPLANT
NS IRRIG 1000ML POUR BTL (IV SOLUTION) ×3 IMPLANT
PACK ORTHO EXTREMITY (CUSTOM PROCEDURE TRAY) ×3 IMPLANT
PACK TOTAL JOINT (CUSTOM PROCEDURE TRAY) ×3 IMPLANT
PACK UNIVERSAL I (CUSTOM PROCEDURE TRAY) ×3 IMPLANT
PAD ARMBOARD 7.5X6 YLW CONV (MISCELLANEOUS) ×6 IMPLANT
PAD CAST 4YDX4 CTTN HI CHSV (CAST SUPPLIES) ×1 IMPLANT
PADDING CAST COTTON 4X4 STRL (CAST SUPPLIES) ×3
PADDING CAST COTTON 6X4 STRL (CAST SUPPLIES) ×3 IMPLANT
PENCIL BUTTON HOLSTER BLD 10FT (ELECTRODE) ×3 IMPLANT
SPONGE LAP 18X18 X RAY DECT (DISPOSABLE) ×9 IMPLANT
SPONGE SCRUB IODOPHOR (GAUZE/BANDAGES/DRESSINGS) ×3 IMPLANT
STAPLER VISISTAT 35W (STAPLE) ×3 IMPLANT
SUCTION FRAZIER TIP 10 FR DISP (SUCTIONS) ×3 IMPLANT
SUT ETHILON 3 0 PS 1 (SUTURE) ×6 IMPLANT
SUT PROLENE 0 CT 2 (SUTURE) IMPLANT
SUT VIC AB 0 CT1 27 (SUTURE) ×6
SUT VIC AB 0 CT1 27XBRD ANBCTR (SUTURE) ×2 IMPLANT
SUT VIC AB 1 CT1 27 (SUTURE) ×6
SUT VIC AB 1 CT1 27XBRD ANBCTR (SUTURE) ×2 IMPLANT
SUT VIC AB 2-0 CT1 27 (SUTURE) ×6
SUT VIC AB 2-0 CT1 TAPERPNT 27 (SUTURE) ×2 IMPLANT
SYR 20ML ECCENTRIC (SYRINGE) IMPLANT
SYR CONTROL 10ML LL (SYRINGE) IMPLANT
TOWEL OR 17X24 6PK STRL BLUE (TOWEL DISPOSABLE) ×3 IMPLANT
TOWEL OR 17X26 10 PK STRL BLUE (TOWEL DISPOSABLE) ×6 IMPLANT
TRAY FOLEY CATH 16FRSI W/METER (SET/KITS/TRAYS/PACK) IMPLANT
TUBE CONNECTING 12'X1/4 (SUCTIONS) ×1
TUBE CONNECTING 12X1/4 (SUCTIONS) ×2 IMPLANT
UNDERPAD 30X30 INCONTINENT (UNDERPADS AND DIAPERS) ×3 IMPLANT
WATER STERILE IRR 1000ML POUR (IV SOLUTION) ×6 IMPLANT

## 2014-11-12 NOTE — OR Nursing (Signed)
Dr. Maple Hudson in to see patient. Initiated ropivicaine via left femoral peripheral nerve catheter.

## 2014-11-12 NOTE — OR Nursing (Signed)
Pt arrived to PACU with left femoral catheter in place for pain management per anesthesia. No infusion present.

## 2014-11-12 NOTE — Progress Notes (Signed)
Orthopedic Tech Progress Note Patient Details:  [name] [name] [birth year] [id number]  CPM Left Knee CPM Left Knee: On Left Knee Flexion (Degrees): 90 Left Knee Extension (Degrees): 0 Additional Comments: trapeze bar patient helper   [name] 11/12/2014, 12:04 PM

## 2014-11-12 NOTE — Anesthesia Preprocedure Evaluation (Addendum)
Anesthesia Evaluation  Patient identified by MRN, date of birth, ID band Patient awake    Reviewed: Allergy & Precautions, H&P , NPO status , Patient's Chart, lab work & pertinent test results  History of Anesthesia Complications Negative for: history of anesthetic complications  Airway Mallampati: I  TM Distance: >3 FB Neck ROM: full    Dental  (+) Poor Dentition, Loose, Dental Advidsory Given,    Pulmonary neg shortness of breath, neg sleep apnea, neg COPDneg recent URI, Current Smoker,  breath sounds clear to auscultation        Cardiovascular negative cardio ROS  Rhythm:Regular     Neuro/Psych PSYCHIATRIC DISORDERS negative neurological ROS     GI/Hepatic negative GI ROS, Neg liver ROS,   Endo/Other  negative endocrine ROS  Renal/GU negative Renal ROS     Musculoskeletal LEFT FEMUR NON ULEFT FEMUR NON UNION REPAIR    Abdominal   Peds  Hematology negative hematology ROS (+)   Anesthesia Other Findings   Reproductive/Obstetrics                           Anesthesia Physical Anesthesia Plan  ASA: II  Anesthesia Plan: General   Post-op Pain Management:    Induction: Intravenous  Airway Management Planned: Oral ETT  Additional Equipment: None  Intra-op Plan:   Post-operative Plan: Extubation in OR  Informed Consent: I have reviewed the patients History and Physical, chart, labs and discussed the procedure including the risks, benefits and alternatives for the proposed anesthesia with the patient or authorized representative who has indicated his/her understanding and acceptance.   Dental Advisory Given  Plan Discussed with: Anesthesiologist, CRNA and Surgeon  Anesthesia Plan Comments:        Anesthesia Quick Evaluation

## 2014-11-12 NOTE — Transfer of Care (Signed)
Immediate Anesthesia Transfer of Care Note  Patient: [name] [name]  Procedure(s) Performed: Procedure(s): LEFT FEMUR NON UNION REPAIR  (Left) REMOVAL OF ANTIBIOTIC SPACERS (Left) LEFT KNEE MANIPULATION , CAPSULOTOMY (Left)  Patient Location: PACU  Anesthesia Type:General  Level of Consciousness: awake, alert  and oriented  Airway & Oxygen Therapy: Patient Spontanous Breathing and Patient connected to nasal cannula oxygen  Post-op Assessment: Report given to RN, Post -op Vital signs reviewed and stable and Patient moving all extremities X 4  Post vital signs: Reviewed and stable  Last Vitals:  Filed Vitals:   11/12/14 0624  BP: 117/79  Pulse: 69  Temp: 36.9 C  Resp: 20    Complications: No apparent anesthesia complications

## 2014-11-12 NOTE — Anesthesia Procedure Notes (Addendum)
Procedure Name: Intubation Date/Time: 11/12/2014 8:16 AM Performed by: Carmela Rima Pre-anesthesia Checklist: Timeout performed, Patient being monitored, Suction available, Emergency Drugs available and Patient identified Patient Re-evaluated:Patient Re-evaluated prior to inductionOxygen Delivery Method: Circle system utilized Preoxygenation: Pre-oxygenation with 100% oxygen Intubation Type: IV induction Ventilation: Mask ventilation without difficulty Laryngoscope Size: Mac and 3 Grade View: Grade I Tube type: Oral Tube size: 7.5 mm Number of attempts: 1 Secured at: 23 cm Tube secured with: Tape Dental Injury: Teeth and Oropharynx as per pre-operative assessment     Anesthesia Regional Block:  Femoral nerve block  Pre-Anesthetic Checklist: ,, timeout performed,, Correct Site, Correct Laterality, Correct Procedure, Correct Position, site marked, surgical consent,, at surgeon's request and post-op pain management  Laterality: Left  Prep: Maximum Sterile Barrier Precautions used and Dura Prep       Needles:  Injection technique: Catheter  Needle Type: Tuohy     Needle Length: 8cm  Needle Gauge: 17 and 17G   Catheter at skin depth: 20 cm  Additional Needles:  Procedures: Doppler guided, ultrasound guided (picture in chart) and nerve stimulator Femoral nerve block  Nerve Stimulator or Paresthesia:  Response: quad, 0.2 mA,   Additional Responses:   Narrative:   Performed by: Personally   Additional Notes: Continuous femoral nerve catheter placed and tunneled under sterile technique in the OR at request of surgeon

## 2014-11-13 ENCOUNTER — Encounter (HOSPITAL_COMMUNITY): Payer: Self-pay | Admitting: Orthopedic Surgery

## 2014-11-13 DIAGNOSIS — E349 Endocrine disorder, unspecified: Secondary | ICD-10-CM

## 2014-11-13 DIAGNOSIS — M24569 Contracture, unspecified knee: Secondary | ICD-10-CM

## 2014-11-13 DIAGNOSIS — S72492K Other fracture of lower end of left femur, subsequent encounter for closed fracture with nonunion: Secondary | ICD-10-CM | POA: Diagnosis not present

## 2014-11-13 HISTORY — DX: Contracture, unspecified knee: M24.569

## 2014-11-13 HISTORY — DX: Endocrine disorder, unspecified: E34.9

## 2014-11-13 LAB — BASIC METABOLIC PANEL
Anion gap: 7 (ref 5–15)
BUN: <5 mg/dL — ABNORMAL LOW (ref 6–20)
CO2: 27 mmol/L (ref 22–32)
Calcium: 8.6 mg/dL — ABNORMAL LOW (ref 8.9–10.3)
Chloride: 104 mmol/L (ref 101–111)
Creatinine, Ser: 0.6 mg/dL — ABNORMAL LOW (ref 0.61–1.24)
GFR calc non Af Amer: >60 mL/min (ref >60)
GLUCOSE: 99 mg/dL (ref 65–99)
POTASSIUM: 3.8 mmol/L (ref 3.5–5.1)
Sodium: 138 mmol/L (ref 135–145)

## 2014-11-13 LAB — CBC
HCT: 33.2 % — ABNORMAL LOW (ref 39.0–52.0)
HEMOGLOBIN: 10.9 g/dL — AB (ref 13.0–17.0)
MCH: 30.7 pg (ref 26.0–34.0)
MCHC: 32.8 g/dL (ref 30.0–36.0)
MCV: 93.5 fL (ref 78.0–100.0)
Platelets: 299 10*3/uL (ref 150–400)
RBC: 3.55 MIL/uL — ABNORMAL LOW (ref 4.22–5.81)
RDW: 15.3 % (ref 11.5–15.5)
WBC: 8.7 10*3/uL (ref 4.0–10.5)

## 2014-11-13 LAB — VITAMIN D 25 HYDROXY (VIT D DEFICIENCY, FRACTURES): Vit D, 25-Hydroxy: 30.6 ng/mL (ref 30.0–100.0)

## 2014-11-13 LAB — TESTOSTERONE: Testosterone: 268 ng/dL — ABNORMAL LOW (ref 348–1197)

## 2014-11-13 LAB — TESTOSTERONE, FREE: TESTOSTERONE FREE: 4.1 pg/mL — AB (ref 6.8–21.5)

## 2014-11-13 LAB — SEX HORMONE BINDING GLOBULIN: SEX HORMONE BINDING: 40.3 nmol/L (ref 16.5–55.9)
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
## 2014-11-13 NOTE — Discharge Instructions (Signed)
[facility] Discharge Instructions   General Discharge Instructions  WEIGHT BEARING STATUS: Partial Weightbearing Left Leg (50 % bodyweight) once block has worn off and quad function returns  RANGE OF MOTION/ACTIVITY:aggressive L knee Range of motion. Use CPM machine 6-8 hours a day minimum   PAIN MEDICATION USE AND EXPECTATIONS  You have likely been given narcotic medications to help control your pain.  After a traumatic event that results in an fracture (broken bone) with or without surgery, it is ok to use narcotic pain medications to help control one's pain.  We understand that everyone responds to pain differently and each individual patient will be evaluated on a regular basis for the continued need for narcotic medications. Ideally, narcotic medication use should last no more than 6-8 weeks (coinciding with fracture healing).   As a patient it is your responsibility as well to monitor narcotic medication use and report the amount and frequency you use these medications when you come to your office visit.   We would also advise that if you are using narcotic medications, you should take a dose prior to therapy to maximize you participation.  IF YOU ARE ON NARCOTIC MEDICATIONS IT IS NOT PERMISSIBLE TO OPERATE A MOTOR VEHICLE (MOTORCYCLE/CAR/TRUCK/MOPED) OR HEAVY MACHINERY DO NOT MIX NARCOTICS WITH OTHER CNS (CENTRAL NERVOUS SYSTEM) DEPRESSANTS SUCH AS ALCOHOL  Diet: as you were eating previously.  Can use over the counter stool softeners and bowel preparations, such as Miralax, to help with bowel movements.  Narcotics can be constipating.  Be sure to drink plenty of fluids  Wound Care: daily wound care starting on 11/15/2014. See detailed instructions below   STOP SMOKING OR USING NICOTINE PRODUCTS!!!!  As discussed nicotine severely impairs your body's ability to heal surgical and traumatic wounds but also impairs bone healing.  Wounds and bone heal by forming microscopic  blood vessels (angiogenesis) and nicotine is a vasoconstrictor (essentially, shrinks blood vessels).  Therefore, if vasoconstriction occurs to these microscopic blood vessels they essentially disappear and are unable to deliver necessary nutrients to the healing tissue.  This is one modifiable factor that you can do to dramatically increase your chances of healing your injury.    (This means no smoking, no nicotine gum, patches, etc)  DO NOT USE NONSTEROIDAL ANTI-INFLAMMATORY DRUGS (NSAID'S)  Using products such as Advil (ibuprofen), Aleve (naproxen), Motrin (ibuprofen) for additional pain control during fracture healing can delay and/or prevent the healing response.  If you would like to take over the counter (OTC) medication, Tylenol (acetaminophen) is ok.  However, some narcotic medications that are given for pain control contain acetaminophen as well. Therefore, you should not exceed more than 4000 mg of tylenol in a day if you do not have liver disease.  Also note that there are may OTC medicines, such as cold medicines and allergy medicines that my contain tylenol as well.  If you have any questions about medications and/or interactions please ask your doctor/PA or your pharmacist.      ICE AND ELEVATE INJURED/OPERATIVE EXTREMITY  Using ice and elevating the injured extremity above your heart can help with swelling and pain control.  Icing in a pulsatile fashion, such as 20 minutes on and 20 minutes off, can be followed.    Do not place ice directly on skin. Make sure there is a barrier between to skin and the ice pack.    Using frozen items such as frozen peas works well as the conform nicely to the are that needs  to be iced.  USE AN ACE WRAP OR TED HOSE FOR SWELLING CONTROL  In addition to icing and elevation, Ace wraps or TED hose are used to help limit and resolve swelling.  It is recommended to use Ace wraps or TED hose until you are informed to stop.    When using Ace Wraps start the  wrapping distally (farthest away from the body) and wrap proximally (closer to the body)   Example: If you had surgery on your leg or thing and you do not have a splint on, start the ace wrap at the toes and work your way up to the thigh        If you had surgery on your upper extremity and do not have a splint on, start the ace wrap at your fingers and work your way up to the upper arm  IF YOU ARE IN A SPLINT OR CAST DO NOT REMOVE IT FOR ANY REASON   If your splint gets wet for any reason please contact the office immediately. You may shower in your splint or cast as long as you keep it dry.  This can be done by wrapping in a cast cover or garbage back (or similar)  Do Not stick any thing down your splint or cast such as pencils, money, or hangers to try and scratch yourself with.  If you feel itchy take benadryl as prescribed on the bottle for itching  IF YOU ARE IN A CAM BOOT (BLACK BOOT)  You may remove boot periodically. Perform daily dressing changes as noted below.  Wash the liner of the boot regularly and wear a sock when wearing the boot. It is recommended that you sleep in the boot until told otherwise  CALL THE OFFICE WITH ANY QUESTIONS OR CONCERTS: [phone]     Discharge Pin Site Instructions  Dress pins daily with Kerlix roll starting on POD 2. Wrap the Kerlix so that it tamps the skin down around the pin-skin interface to prevent/limit motion of the skin relative to the pin.  (Pin-skin motion is the primary cause of pain and infection related to external fixator pin sites).  Remove any crust or coagulum that may obstruct drainage with a saline moistened gauze or soap and water.  After POD 3, if there is no discernable drainage on the pin site dressing, the interval for change can by increased to every other day.  You may shower with the fixator, cleaning all pin sites gently with soap and water.  If you have a surgical wound this needs to be completely dry and without  drainage before showering.  The extremity can be lifted by the fixator to facilitate wound care and transfers.  Notify the office/Doctor if you experience increasing drainage, redness, or pain from a pin site, or if you notice purulent (thick, snot-like) drainage.  Discharge Wound Care Instructions  Do NOT apply any ointments, solutions or lotions to pin sites or surgical wounds.  These prevent needed drainage and even though solutions like hydrogen peroxide kill bacteria, they also damage cells lining the pin sites that help fight infection.  Applying lotions or ointments can keep the wounds moist and can cause them to breakdown and open up as well. This can increase the risk for infection. When in doubt call the office.  Surgical incisions should be dressed daily.  If any drainage is noted, use one layer of adaptic, then gauze, Kerlix, and an ace wrap.  Once the incision is completely dry and  without drainage, it may be left open to air out.  Showering may begin 36-48 hours later.  Cleaning gently with soap and water.  Traumatic wounds should be dressed daily as well.    One layer of adaptic, gauze, Kerlix, then ace wrap.  The adaptic can be discontinued once the draining has ceased    If you have a wet to dry dressing: wet the gauze with saline the squeeze as much saline out so the gauze is moist (not soaking wet), place moistened gauze over wound, then place a dry gauze over the moist one, followed by Kerlix wrap, then ace wrap.

## 2014-11-13 NOTE — Evaluation (Signed)
Occupational Therapy Evaluation and Discharge Patient Details Name: [name] [name] MRN: [id number] DOB: [birth year] Today's Date: 11/13/2014    History of Present Illness Patient is a 49 y/o male s/p Repair of left distal femur nonunion, open capsulotomy of the left knee with tenolysis of the quadriceps, manipulation under anesthesia of left knee joint and removal of spacer. Pt underwent ORIF left femur and acetabulum on 5/6.   Clinical Impression   This 49 yo male admitted and underwent above presents to acute OT at a set/S level for most tasks with increased A need for LBADLs which he says he has adequate A for at home. No further OT needs, we will D/C from acute OT.    Follow Up Recommendations  No OT follow up    Equipment Recommendations  None recommended by OT       Precautions / Restrictions Precautions Precautions: Knee Precaution Comments: Encouraged zero degree knee foam at all times when OOB or when not in CPM. Pt with femoral nerve catheter for postop pain. Okay to mobilize per Dr. Maple Hudson. Restrictions Weight Bearing Restrictions: Yes LLE Weight Bearing: Non weight bearing Other Position/Activity Restrictions: NWB until femoral nerve block wears off and then PWB 50% LLE.      Mobility Bed Mobility Overal bed mobility: Modified Independent                Transfers Overall transfer level: Modified independent Equipment used: Rolling walker (2 wheeled)             General transfer comment: Mod I to stand from EOB with RW. Compliant with NWb LLE secondary to numbing affect from nerve block catheter.    Balance Overall balance assessment: Needs assistance Sitting-balance support: Feet supported;No upper extremity supported Sitting balance-Leahy Scale: Good     Standing balance support: During functional activity Standing balance-Leahy Scale: Fair                              ADL Overall ADL's : Needs  assistance/impaired Eating/Feeding: Independent;Sitting   Grooming: Set up;Sitting   Upper Body Bathing: Set up;Sitting   Lower Body Bathing: Moderate assistance (with S sit<>stand)   Upper Body Dressing : Set up;Sitting   Lower Body Dressing: Moderate assistance (with S sit<>stand)   Toilet Transfer: Supervision/safety;Ambulation;RW;BSC (over toilet)   Toileting- Clothing Manipulation and Hygiene: Supervision/safety;Sit to/from stand               Vision Additional Comments: No change from baseline          Pertinent Vitals/Pain Pain Assessment: Faces Faces Pain Scale: Hurts little more Pain Location: left quad Pain Descriptors / Indicators: Sore Pain Intervention(s): Monitored during session;Repositioned     Hand Dominance Right   Extremity/Trunk Assessment Upper Extremity Assessment Upper Extremity Assessment: Overall WFL for tasks assessed   Lower Extremity Assessment Lower Extremity Assessment: Defer to PT evaluation;LLE deficits/detail LLE Deficits / Details: Lacking knee AROM secondary to pain. Able to initiate quadricep contraction. Able to wiggle toes.  LLE Sensation: decreased light touch LLE Coordination: decreased gross motor;decreased fine motor       Communication Communication Communication: No difficulties   Cognition Arousal/Alertness: Awake/alert Behavior During Therapy: WFL for tasks assessed/performed Overall Cognitive Status: Within Functional Limits for tasks assessed                                Home  Living Family/patient expects to be discharged to:: Private residence Living Arrangements: Children Available Help at Discharge: Family;Available PRN/intermittently Type of Home: Mobile home Home Access: Ramped entrance     Home Layout: One level               Home Equipment: Walker - 2 wheels;Bedside commode;Shower seat          Prior Functioning/Environment Level of Independence: Independent with  assistive device(s)        Comments: Pt using RW PTA.     OT Diagnosis: Generalized weakness;Acute pain         OT Goals(Current goals can be found in the care plan section) Acute Rehab OT Goals Patient Stated Goal: to go home  OT Frequency:             Co-evaluation PT/OT/SLP Co-Evaluation/Treatment: Yes Reason for Co-Treatment: Other (comment) (had been waiting on clarification on femoral nerve block line since this AM)   OT goals addressed during session: ADL's and self-care;Strengthening/ROM      End of Session Equipment Utilized During Treatment: Rolling walker CPM Left Knee CPM Left Knee: Off Additional Comments: zero degree knee under ankle.  Activity Tolerance: Patient tolerated treatment well Patient left: in chair;with call bell/phone within reach   Time: 1410-1424 OT Time Calculation (min): 14 min Charges:  OT General Charges $OT Visit: 1 Procedure OT Evaluation $Initial OT Evaluation Tier I: 1 Procedure G-Codes: OT G-codes **NOT FOR INPATIENT CLASS** Functional Assessment Tool Used: Clinical observation Functional Limitation: Self care Self Care Current Status (Q7225): At least 40 percent but less than 60 percent impaired, limited or restricted Self Care Goal Status (J5051): At least 40 percent but less than 60 percent impaired, limited or restricted Self Care Discharge Status [phone]): At least 40 percent but less than 60 percent impaired, limited or restricted  Evette Georges [phone] 11/13/2014, 3:07 PM

## 2014-11-13 NOTE — Progress Notes (Signed)
Anesthesia Pain Note Pt seen and examined. Pain increasing with infusion being off. Bolused 15 ml 0.5% bupivacaine. Catheter gently pulled, no resistance, tip intact. Will be d/c home after ~ 1 hr post bolus.

## 2014-11-13 NOTE — Care Management Note (Signed)
Case Management Note  Patient Details  Name: [name] [name] MRN: [id number] Date of Birth: Oct 26, 1965  Subjective/Objective:             S/p repair of distal femur nonunion       Action/Plan: PT/OT evals- no home therapy or equipment needs identified. CPM ordered for home. Contacted Kipp Brood with [employer] [facility] [employer], they will deliver CPM to patient's home. Faxed order and demographic sheet to [phone] and received confirmation. Updated patient regarding CPM.   Expected Discharge Date:                  Expected Discharge Plan:  Home/Self Care  In-House Referral:  NA  Discharge planning Services  CM Consult  Post Acute Care Choice:  Durable Medical Equipment Choice offered to:  NA  DME Arranged:  CPM DME Agency:  [employer]  HH Arranged:    HH Agency:     Status of Service:  Completed, signed off  Medicare Important Message Given:    Date Medicare IM Given:    Medicare IM give by:    Date Additional Medicare IM Given:    Additional Medicare Important Message give by:     If discussed at Long Length of Stay Meetings, dates discussed:    Additional Comments:  Monica Becton, RN 11/13/2014, 4:18 PM
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
## 2014-11-13 NOTE — Evaluation (Addendum)
Physical Therapy Evaluation Patient Details Name: [name] [name] MRN: [id number] DOB: 01-02-1966 Today's Date: 11/13/2014   History of Present Illness  Patient is a 49 y/o male s/p Repair of left distal femur nonunion, open capsulotomy of the left knee with tenolysis of the quadriceps, manipulation under anesthesia of left knee joint and removal of spacer. Pt underwent ORIF left femur and acetabulum on 5/6.  Clinical Impression  Patient presents with pain and post surgical deficits LLE s/p above surgery. Pt has femoral nerve catheter to assist with pain control while in hospital. Pt is functioning Mod I for mobility and compliant with NWb LLE during gait training. Education provided on importance of maintaining knee extension at all times and using CPM. Pt has assist at home. Pt does not require further skilled therapy services. Discharge from therapy.    Follow Up Recommendations No PT follow up;Supervision - Intermittent    Equipment Recommendations  None recommended by PT    Recommendations for Other Services       Precautions / Restrictions Precautions Precautions: Knee Precaution Comments: Encouraged zero degree knee foam at all times when OOB or when not in CPM. Pt with femoral nerve catheter for postop pain. Okay to mobilize per Dr. Maple Hudson. Restrictions Weight Bearing Restrictions: Yes LLE Weight Bearing: Non weight bearing Other Position/Activity Restrictions: NWB until femoral nerve block wears off and then PWB 50% LLE.      Mobility  Bed Mobility Overal bed mobility: Modified Independent                Transfers Overall transfer level: Modified independent Equipment used: Rolling walker (2 wheeled)             General transfer comment: Mod I to stand from EOB with RW. Compliant with NWb LLE secondary to numbing affect from nerve block catheter.  Ambulation/Gait Ambulation/Gait assistance: Modified independent (Device/Increase time) Ambulation Distance  (Feet): 150 Feet Assistive device: Rolling walker (2 wheeled) Gait Pattern/deviations: Step-to pattern   Gait velocity interpretation: Below normal speed for age/gender General Gait Details: "hop to" gait pattern. Compliant with NWB LLE.   Stairs            Wheelchair Mobility    Modified Rankin (Stroke Patients Only)       Balance Overall balance assessment: Needs assistance Sitting-balance support: Feet supported;No upper extremity supported Sitting balance-Leahy Scale: Good     Standing balance support: During functional activity Standing balance-Leahy Scale: Fair                               Pertinent Vitals/Pain Pain Assessment: Faces Faces Pain Scale: Hurts little more Pain Location: Left quad Pain Descriptors / Indicators: Sore Pain Intervention(s): Monitored during session;Repositioned    Home Living Family/patient expects to be discharged to:: Private residence Living Arrangements: Children Available Help at Discharge: Family;Available PRN/intermittently Type of Home: Mobile home Home Access: Ramped entrance     Home Layout: One level Home Equipment: Walker - 2 wheels;Bedside commode;Shower seat      Prior Function Level of Independence: Independent with assistive device(s)         Comments: Pt using RW PTA.      Hand Dominance   Dominant Hand: Right    Extremity/Trunk Assessment   Upper Extremity Assessment: Defer to OT evaluation           Lower Extremity Assessment: Defer to PT evaluation;LLE deficits/detail   LLE Deficits / Details:  Lacking knee AROM secondary to pain. Able to initiate quadricep contraction. Able to wiggle toes.      Communication   Communication: No difficulties  Cognition Arousal/Alertness: Awake/alert Behavior During Therapy: WFL for tasks assessed/performed Overall Cognitive Status: Within Functional Limits for tasks assessed                      General Comments       Exercises General Exercises - Lower Extremity Quad Sets: Left;10 reps;Supine      Assessment/Plan    PT Assessment Patient needs continued PT services  PT Diagnosis Acute pain;Generalized weakness   PT Problem List Decreased strength;Pain;Decreased range of motion;Impaired sensation;Decreased mobility  PT Treatment Interventions Balance training;Gait training;Functional mobility training;Therapeutic activities;Therapeutic exercise;Patient/family education   PT Goals (Current goals can be found in the Care Plan section) Acute Rehab PT Goals Patient Stated Goal: to go home PT Goal Formulation: With patient Time For Goal Achievement: 11/27/14 Potential to Achieve Goals: Good    Frequency Min 4X/week   Barriers to discharge        Co-evaluation               End of Session Equipment Utilized During Treatment: Gait belt Activity Tolerance: Patient tolerated treatment well Patient left: in chair;with call bell/phone within reach Nurse Communication: Mobility status    Functional Assessment Tool Used: Clinical judgment Functional Limitation: Mobility: Walking and moving around Mobility: Walking and Moving Around Current Status [phone]): At least 1 percent but less than 20 percent impaired, limited or restricted Mobility: Walking and Moving Around Goal Status [phone]): At least 1 percent but less than 20 percent impaired, limited or restricted Mobility: Walking and Moving Around Discharge Status [phone]): At least 1 percent but less than 20 percent impaired, limited or restricted    Time: 1410-1428 PT Time Calculation (min) (ACUTE ONLY): 18 min   Charges:   PT Evaluation $Initial PT Evaluation Tier I: 1 Procedure     PT G Codes:   PT G-Codes **NOT FOR INPATIENT CLASS** Functional Assessment Tool Used: Clinical judgment Functional Limitation: Mobility: Walking and moving around Mobility: Walking and Moving Around Current Status ([id number]): At least 1 percent but less than  20 percent impaired, limited or restricted Mobility: Walking and Moving Around Goal Status [phone]): At least 1 percent but less than 20 percent impaired, limited or restricted Mobility: Walking and Moving Around Discharge Status [phone]): At least 1 percent but less than 20 percent impaired, limited or restricted    Deandra Goering A Velton Roselle 11/13/2014, 2:47 PM Mylo Red, PT, DPT [phone]

## 2014-11-13 NOTE — Care Management (Signed)
Utilization review completed by Reinhart Saulters N. Avion Patella, RN BSN 

## 2014-11-13 NOTE — Progress Notes (Signed)
Orthopaedic Trauma Service Progress Note  Subjective  Doing great  In CPM  Continuous fem nv catheter functioning   Ready to go home   Review of Systems  Constitutional: Negative for fever and chills.  Respiratory: Negative for shortness of breath.   Cardiovascular: Negative for chest pain and palpitations.  Gastrointestinal: Negative for nausea, vomiting and abdominal pain.  Neurological: Positive for sensory change. Negative for headaches.       Continuous femoral nv block      Objective   BP 122/64 mmHg  Pulse 86  Temp(Src) 99.6 F (37.6 C) (Oral)  Resp 18  Ht '5\' 3"'  (1.6 m)  Wt 63.504 kg (140 lb)  BMI 24.81 kg/m2  SpO2 99%  Intake/Output      06/16 0701 - 06/17 0700 06/17 0701 - 06/18 0700   P.O. 580 240   I.V. (mL/kg) 2701.7 (42.5)    IV Piggyback 100    Total Intake(mL/kg) 3381.7 (53.3) 240 (3.8)   Urine (mL/kg/hr) 2475 (1.6) 900 (4)   Blood 50 (0)    Total Output 2525 900   Net +856.7 -660        Urine Occurrence  2 x     Labs Results for [name] [name], [name] [name] (MRN [id number]) as of 11/13/2014 10:34  Ref. Range 11/13/2014 04:04  Sodium Latest Ref Range: 135-145 mmol/L 138  Potassium Latest Ref Range: 3.5-5.1 mmol/L 3.8  Chloride Latest Ref Range: 101-111 mmol/L 104  CO2 Latest Ref Range: 22-32 mmol/L 27  BUN Latest Ref Range: 6-20 mg/dL <5 (L)  Creatinine Latest Ref Range: 0.61-1.24 mg/dL 0.60 (L)  Calcium Latest Ref Range: 8.9-10.3 mg/dL 8.6 (L)  EGFR (Non-African Amer.) Latest Ref Range: >60 mL/min >60  EGFR (African American) Latest Ref Range: >60 mL/min >60  Glucose Latest Ref Range: 65-99 mg/dL 99  Anion gap Latest Ref Range: 5-15  7  WBC Latest Ref Range: 4.0-10.5 K/uL 8.7  RBC Latest Ref Range: 4.22-5.81 MIL/uL 3.55 (L)  Hemoglobin Latest Ref Range: 13.0-17.0 g/dL 10.9 (L)  HCT Latest Ref Range: 39.0-52.0 % 33.2 (L)  MCV Latest Ref Range: 78.0-100.0 fL 93.5  MCH Latest Ref Range: 26.0-34.0 pg 30.7  MCHC Latest Ref Range: 30.0-36.0 g/dL 32.8   RDW Latest Ref Range: 11.5-15.5 % 15.3  Platelets Latest Ref Range: 150-400 K/uL 299    Exam  Gen: awake and alert, NAD, comfortable Lungs: clear Cardiac: RRR Abd: NTND, + BS Ext:       Left Lower Extremity   Dressing c/d/i  Fem catheter in place   Pt in CPM   Moves toes and ankle well- motor function at baseline  Can't extend knee on own   Assessment and Plan   POD/HD#: 1   1.Retained abx spacer L distal femur, 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 Pain management:  Femoral catheter to be addressed by anesthesia  Pt has pain meds at home, no additional rx's at dc   3. ABL anemia/Hemodynamics  Stable  4. Medical issues   Improved vitamin d and Testosterone levels since last admission   Continue with vitamin D supplementation    5. DVT/PE prophylaxis:  No pharmacologic dvt prophylaxis  6. ID:  Completed periop abx   7. Activity:  Per #1  8. FEN/Foley/Lines:  Diet as tolerated   L fem cath line per anesthesia   9. Dispo:  Dc home today after fem cath dc'd  Resume outpt PT  Follow up with ortho in 10-14 days     Jari Pigg, PA-C [facility] [phone] (O) 11/13/2014 10:32 AM

## 2014-11-13 NOTE — Progress Notes (Addendum)
Pt being discharged home via wheelchair with family. Pt alert and oriented x4. VSS. Pt c/o no pain at this time. No signs of respiratory distress. Education complete and care plans resolved. IV removed with catheter intact and pt tolerated well. No further issues at this time. Pt to follow up with PCP. Ector Laurel R, RN 

## 2014-11-13 NOTE — Anesthesia Postprocedure Evaluation (Signed)
  Anesthesia Post-op Note  Patient: [name] [name]  Procedure(s) Performed: Procedure(s): LEFT FEMUR NON UNION REPAIR  (Left) REMOVAL OF ANTIBIOTIC SPACERS (Left) LEFT KNEE MANIPULATION , CAPSULOTOMY (Left)  Patient Location: Nursing Unit  Anesthesia Type:General and Regional  Level of Consciousness: awake, alert  and oriented  Airway and Oxygen Therapy: Patient Spontanous Breathing  Post-op Pain: mild  Post-op Assessment: Post-op Vital signs reviewed, Patient's Cardiovascular Status Stable, Respiratory Function Stable, Patent Airway, No signs of Nausea or vomiting and Pain level controlled LLE Motor Response: Purposeful movement, Responds to commands LLE Sensation: Decreased, Numbness, Pain, No tingling (Numbness, tingling, dec sensation of L groin area. )          Post-op Vital Signs: Reviewed and stable  Last Vitals:  Filed Vitals:   11/13/14 1546  BP: 128/62  Pulse: 82  Temp:   Resp: 18    Complications: No apparent anesthesia complications

## 2014-11-16 DIAGNOSIS — D649 Anemia, unspecified: Secondary | ICD-10-CM | POA: Diagnosis not present

## 2014-11-16 DIAGNOSIS — S72452E Displaced supracondylar fracture without intracondylar extension of lower end of left femur, subsequent encounter for open fracture type I or II with routine healing: Secondary | ICD-10-CM | POA: Diagnosis not present

## 2014-11-16 DIAGNOSIS — S32462D Displaced associated transverse-posterior fracture of left acetabulum, subsequent encounter for fracture with routine healing: Secondary | ICD-10-CM | POA: Diagnosis not present

## 2014-11-17 LAB — TESTOSTERONE, % FREE: Testosterone-% Free: 1.7 % — ABNORMAL HIGH (ref 0.2–0.7)

## 2014-11-19 LAB — VITAMIN D 1,25 DIHYDROXY
VITAMIN D 1, 25 (OH) TOTAL: 36 pg/mL
VITAMIN D3 1, 25 (OH): 30 pg/mL

## 2016-01-24 ENCOUNTER — Emergency Department (HOSPITAL_COMMUNITY): Payer: 59

## 2016-01-24 ENCOUNTER — Encounter (HOSPITAL_COMMUNITY): Payer: Self-pay | Admitting: Emergency Medicine

## 2016-01-24 ENCOUNTER — Emergency Department (HOSPITAL_COMMUNITY)
Admission: EM | Admit: 2016-01-24 | Discharge: 2016-01-24 | Disposition: A | Discharge status: Home / Self Care | Payer: 59 | Attending: Emergency Medicine | Admitting: Emergency Medicine

## 2016-01-24 DIAGNOSIS — Y929 Unspecified place or not applicable: Secondary | ICD-10-CM | POA: Insufficient documentation

## 2016-01-24 DIAGNOSIS — Y9372 Activity, wrestling: Secondary | ICD-10-CM | POA: Insufficient documentation

## 2016-01-24 DIAGNOSIS — W1839XA Other fall on same level, initial encounter: Secondary | ICD-10-CM | POA: Diagnosis not present

## 2016-01-24 DIAGNOSIS — Y999 Unspecified external cause status: Secondary | ICD-10-CM | POA: Insufficient documentation

## 2016-01-24 DIAGNOSIS — M25511 Pain in right shoulder: Secondary | ICD-10-CM | POA: Diagnosis not present

## 2016-01-24 DIAGNOSIS — Z96642 Presence of left artificial hip joint: Secondary | ICD-10-CM | POA: Diagnosis not present

## 2016-01-24 DIAGNOSIS — F17210 Nicotine dependence, cigarettes, uncomplicated: Secondary | ICD-10-CM | POA: Diagnosis not present

## 2016-01-24 MED ORDER — OXYCODONE-ACETAMINOPHEN 5-325 MG PO TABS: 1.0000 | ORAL_TABLET | ORAL | 0 refills | Status: DC | PRN

## 2016-01-24 MED ORDER — HYDROCODONE-ACETAMINOPHEN 5-325 MG PO TABS
1.0000 | ORAL_TABLET | Freq: Once | ORAL | Status: AC
  Administered 2016-01-24: 1 via ORAL
  Filled 2016-01-24: qty 1
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
## 2016-01-24 NOTE — ED Notes (Signed)
Patient returned to room, placed back on BP and pulse ox.

## 2016-01-24 NOTE — Discharge Instructions (Signed)
Your medications as prescribed as an for pain. I also recommend taking 600 mg ibuprofen every 6 hours for pain relief. Continue to rest and apply ice to your right shoulder for 15 minutes 3-4 times daily to help with pain and swelling. Follow-up with your orthopedist in the next week if your pain has not improved. Please return to the Emergency Department if symptoms worsen or new onset of fever, redness, numbness, tingling, weakness.

## 2016-01-24 NOTE — ED Triage Notes (Signed)
Pt states he was wrestling with son and felt his R shoulder pop. States its painful to lift arm up.

## 2016-08-03 IMAGING — CT CT KNEE*L* W/O CM
3 of 5 series · 16 of 34 positions shown, 18 images · non-contrast
Comparison: None.

CLINICAL DATA: Evaluate femur fracture.  Preoperative planning.

EXAM:
CT OF THE left KNEE WITHOUT CONTRAST
TECHNIQUE: Multidetector CT imaging of the left knee was performed according to
the standard protocol. Multiplanar CT image reconstructions were
also generated.

[Series 3: lfov ext 3.0 b40s · axial · 0.48mm/px · z∈[-1001,-797]mm · 8 of 82 slices shown, 10 images]
[im 7/82  soft-tissue]
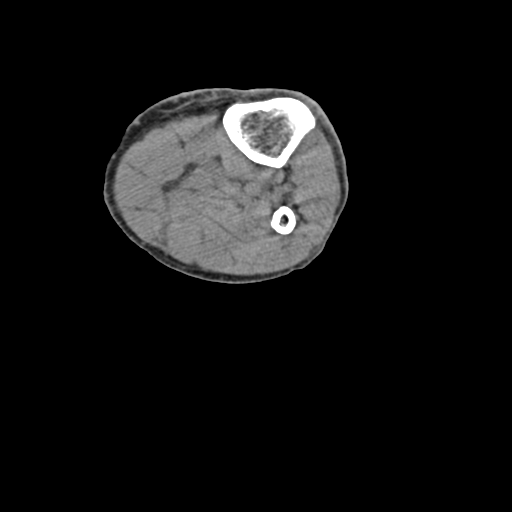
[im 7/82  bone]
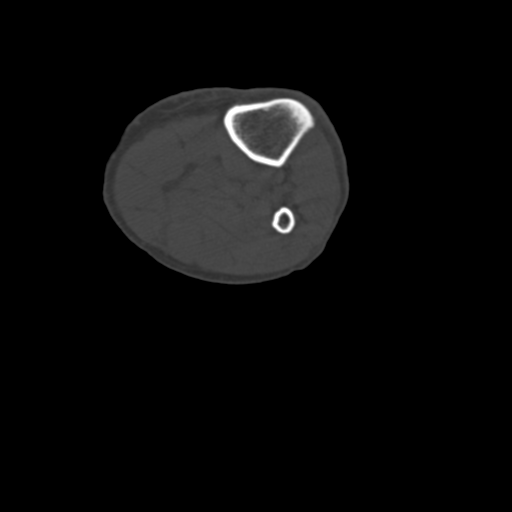
[im 19/82  bone]
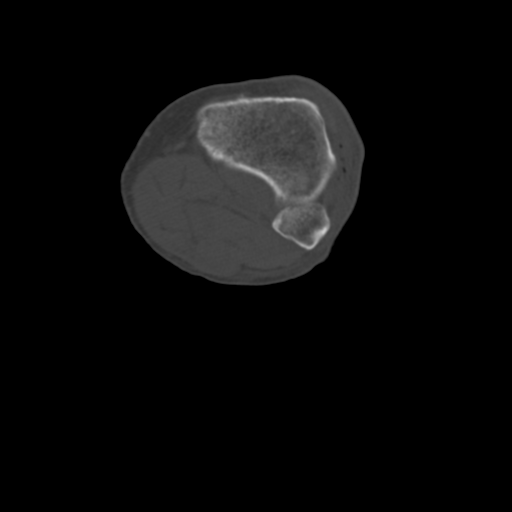
[im 25/82  bone]
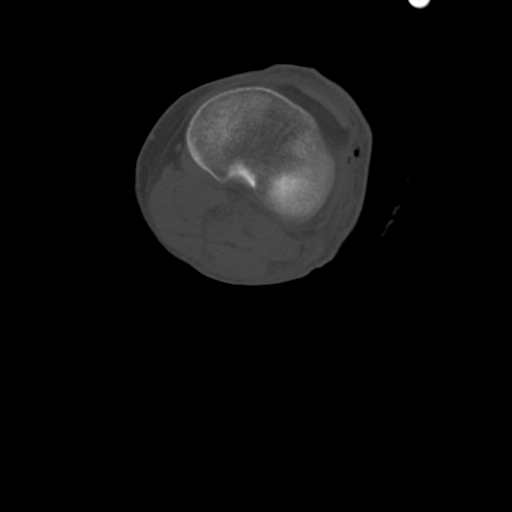
[im 38/82  bone]
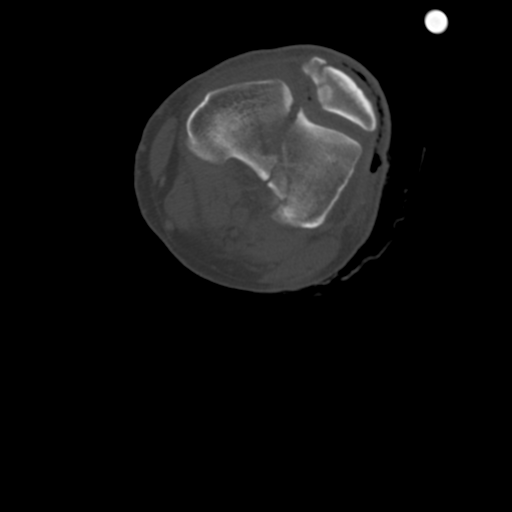
[im 44/82  soft-tissue]
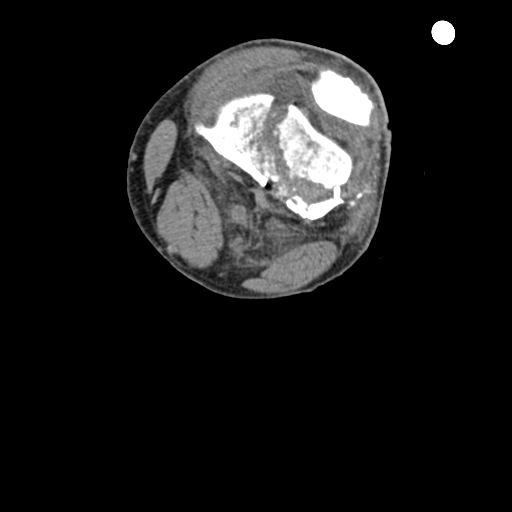
[im 44/82  bone]
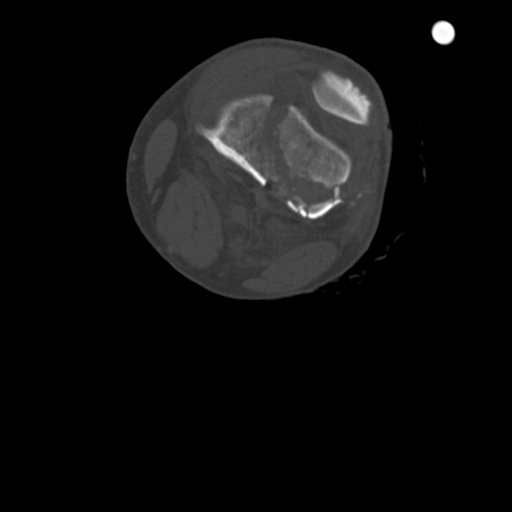
[im 57/82  bone]
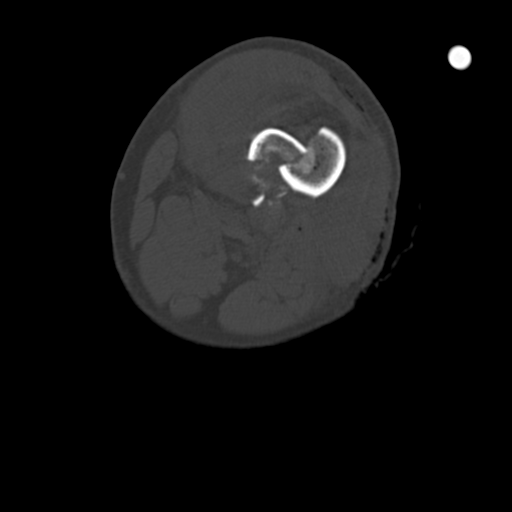
[im 63/82  bone]
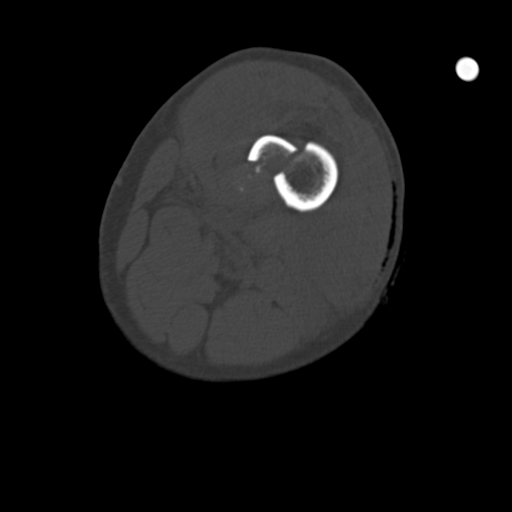
[im 75/82  bone]
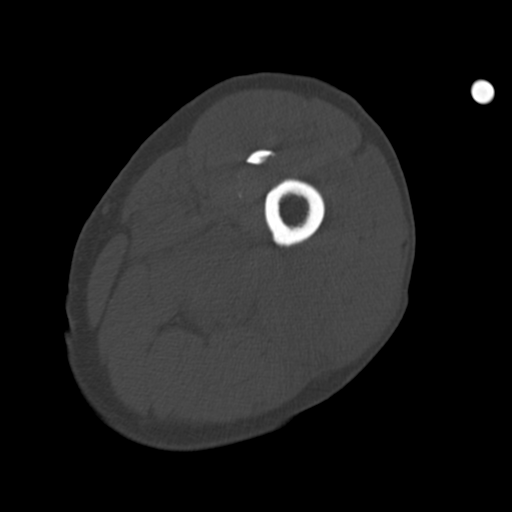

[Series 8: coronalsoft tissue · coronal · 0.46mm/px · 2 of 87 slices shown]
[im 29/87  bone]
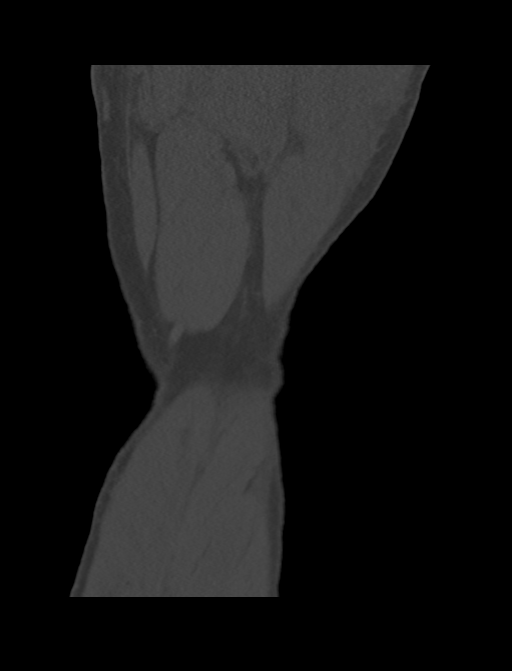
[im 58/87  bone]
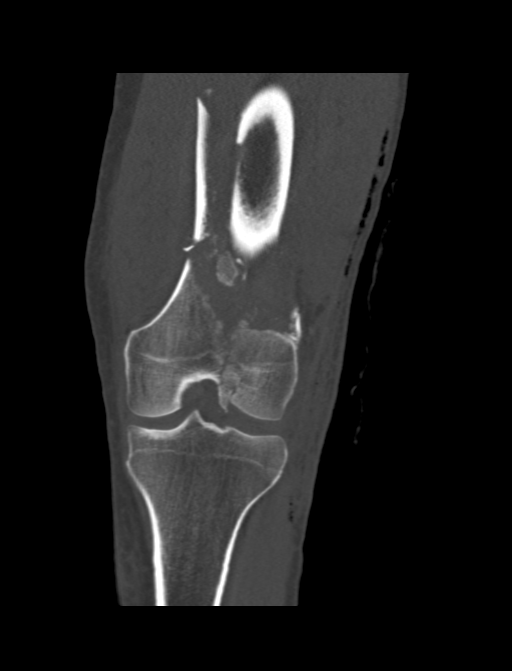

[Series 9: sagittalsoft tissue · sagittal · 0.50mm/px · 6 of 71 slices shown]
[im 12/71  bone]
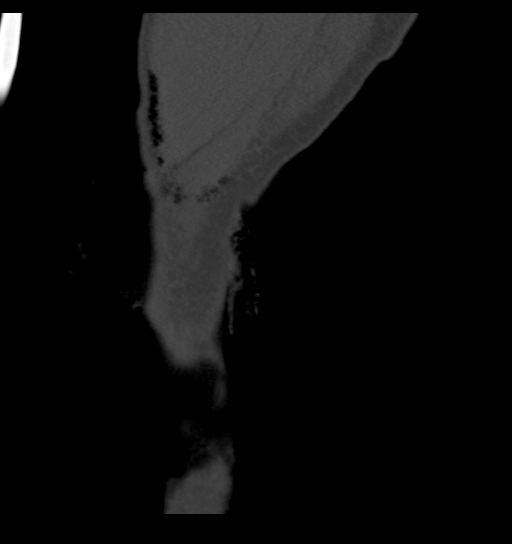
[im 24/71  bone]
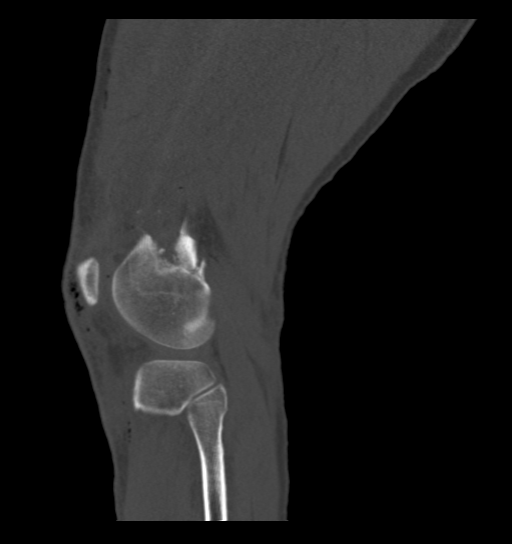
[im 36/71  bone]
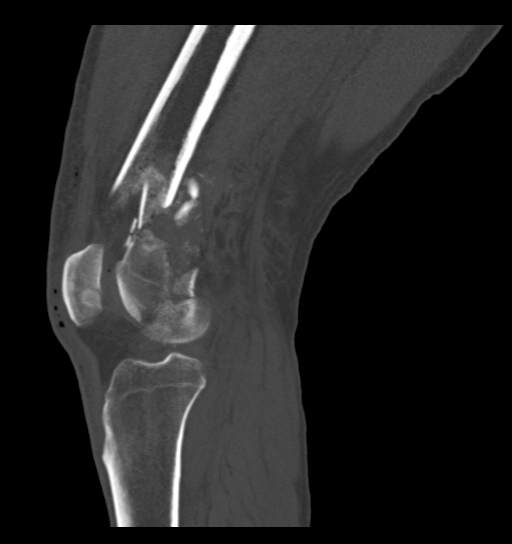
[im 47/71  bone]
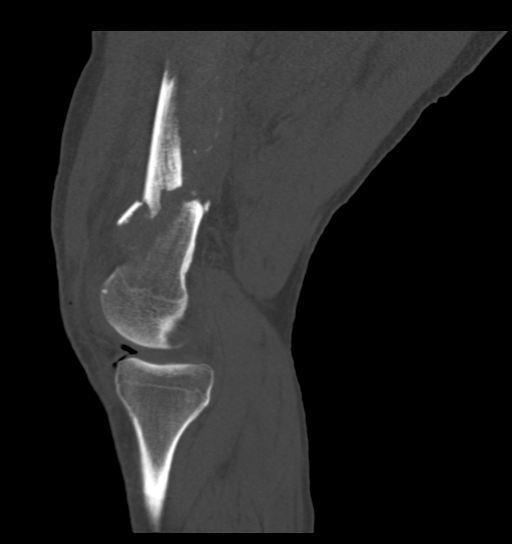
[im 51/71  soft-tissue]
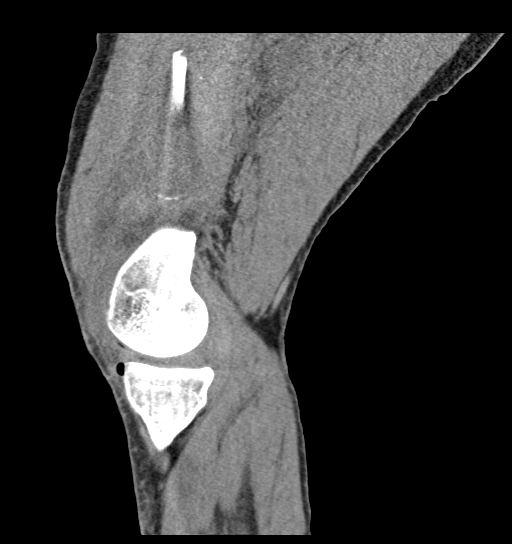
[im 59/71  bone]
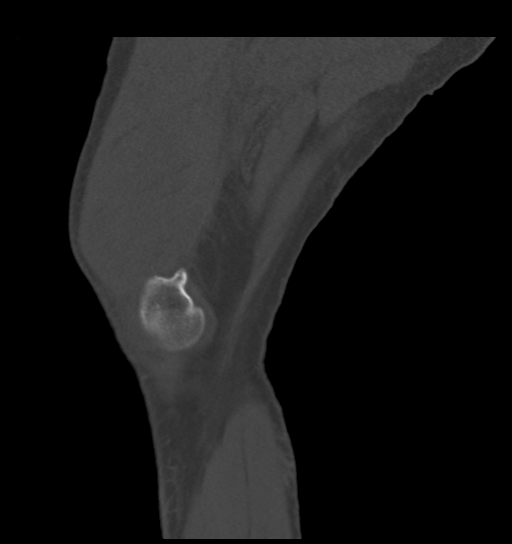

[16 of 34 positions shown; findings below may reference images not displayed]

FINDINGS: Severely comminuted and displaced fractures of the distal femur. The
supracondylar portion is displaced posteriorly approximately 1 shaft
width. The vertical components extend down to the articular surface
of the intercondylar notch and medial aspect of the lateral femoral
condyle. There is a 3.5 mm step-off at the articular surface of the
lateral femoral condyle and a few tiny loose fracture fragments in
the joint.

The medial femoral condyle is intact. No tibial plateau fracture.
The fibula is intact. There is a nondisplaced fracture involving the
medial aspect of the patellar with a nondisplaced intra-articular
component.

Large joint effusion. There is also air in the joint and in the soft
tissues consistent with an open injury.
IMPRESSION: 1. Severely complex comminuted intra-articular fracture of the
distal femur as discussed above. Please see 3D reformats.
2. Nondisplaced fracture involving the medial patellar facet.
3. No tibial plateau fracture.
4. Air in the joint and soft tissues consistent with an open injury.
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
## 2016-08-03 IMAGING — CR DG PELVIS 3+V JUDET
3 series · 3 of 3 positions shown · non-contrast
Comparison: None.

CLINICAL DATA: Evaluate acetabular fracture

EXAM:
JUDET PELVIS - 3+ VIEW

[[person_name] view (1 of 3)]
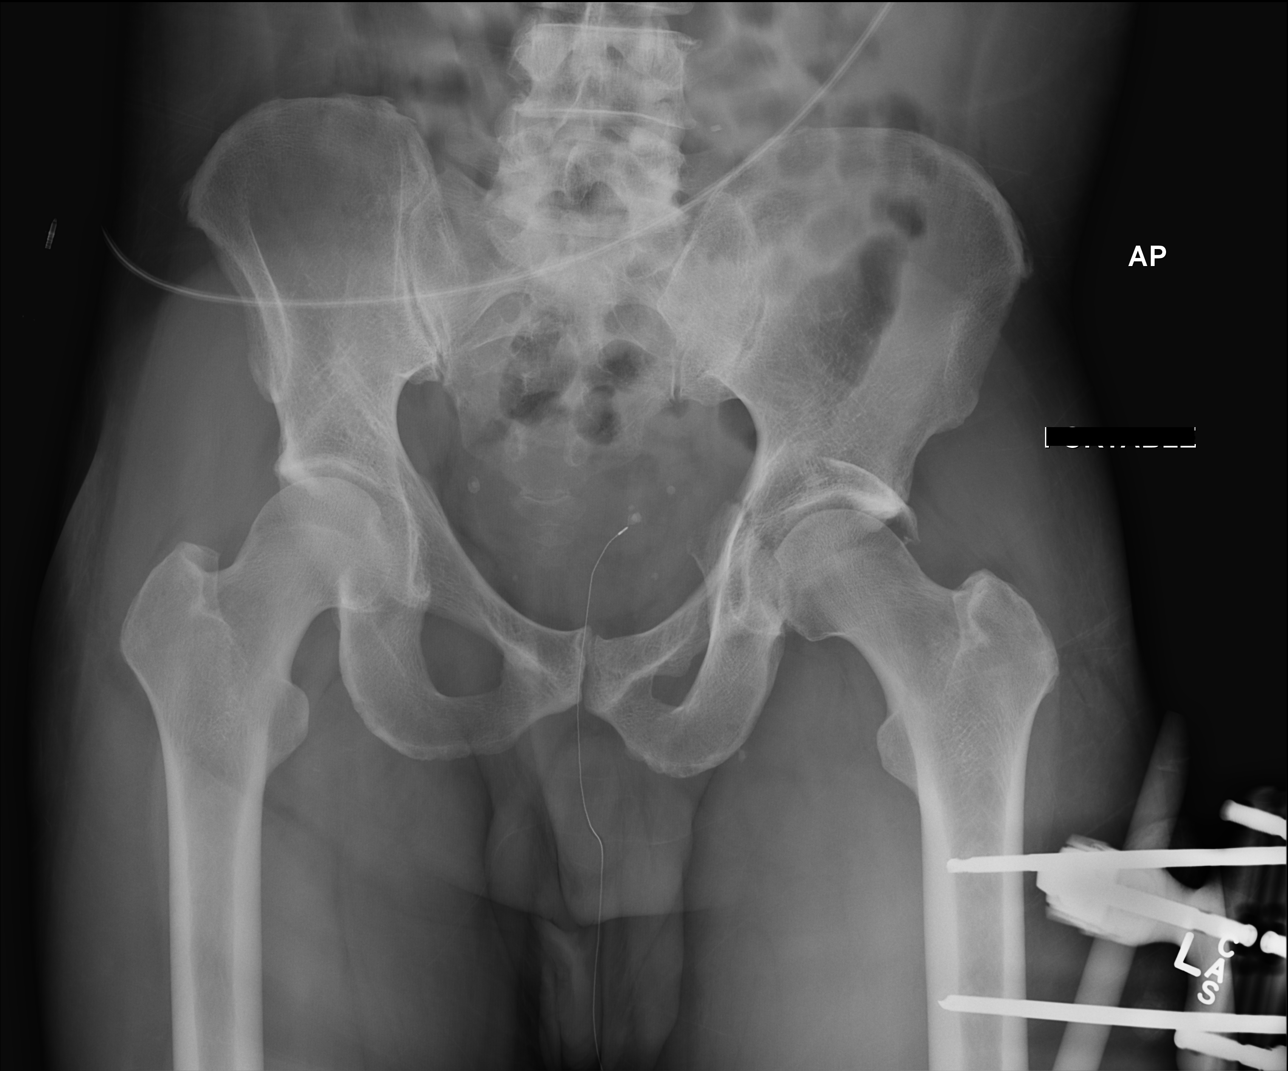

[[person_name] view (2 of 3)]
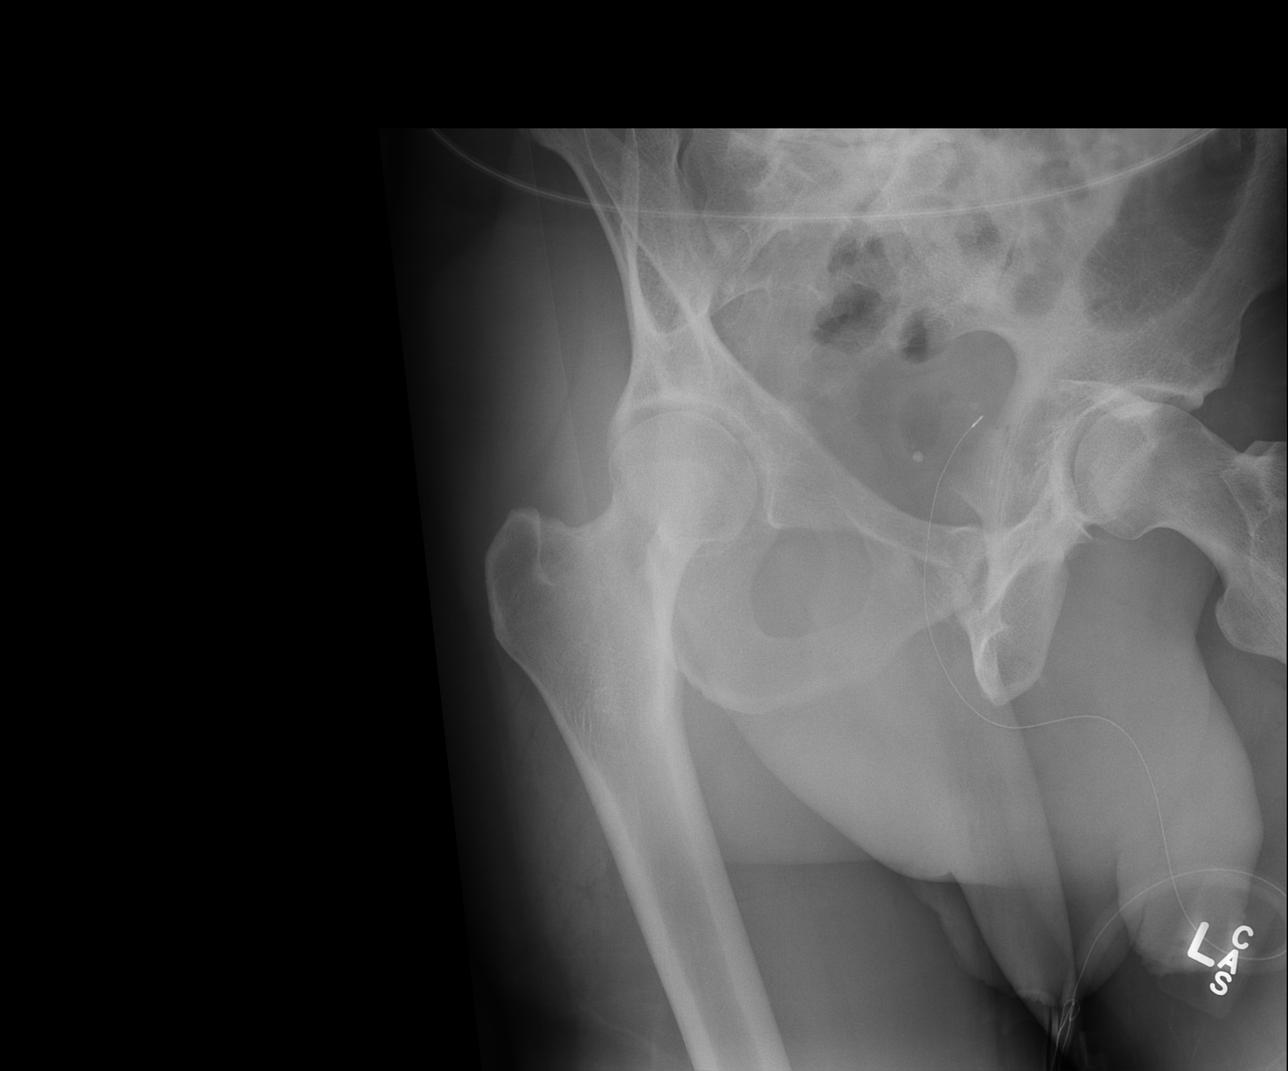

[[person_name] view (3 of 3)]
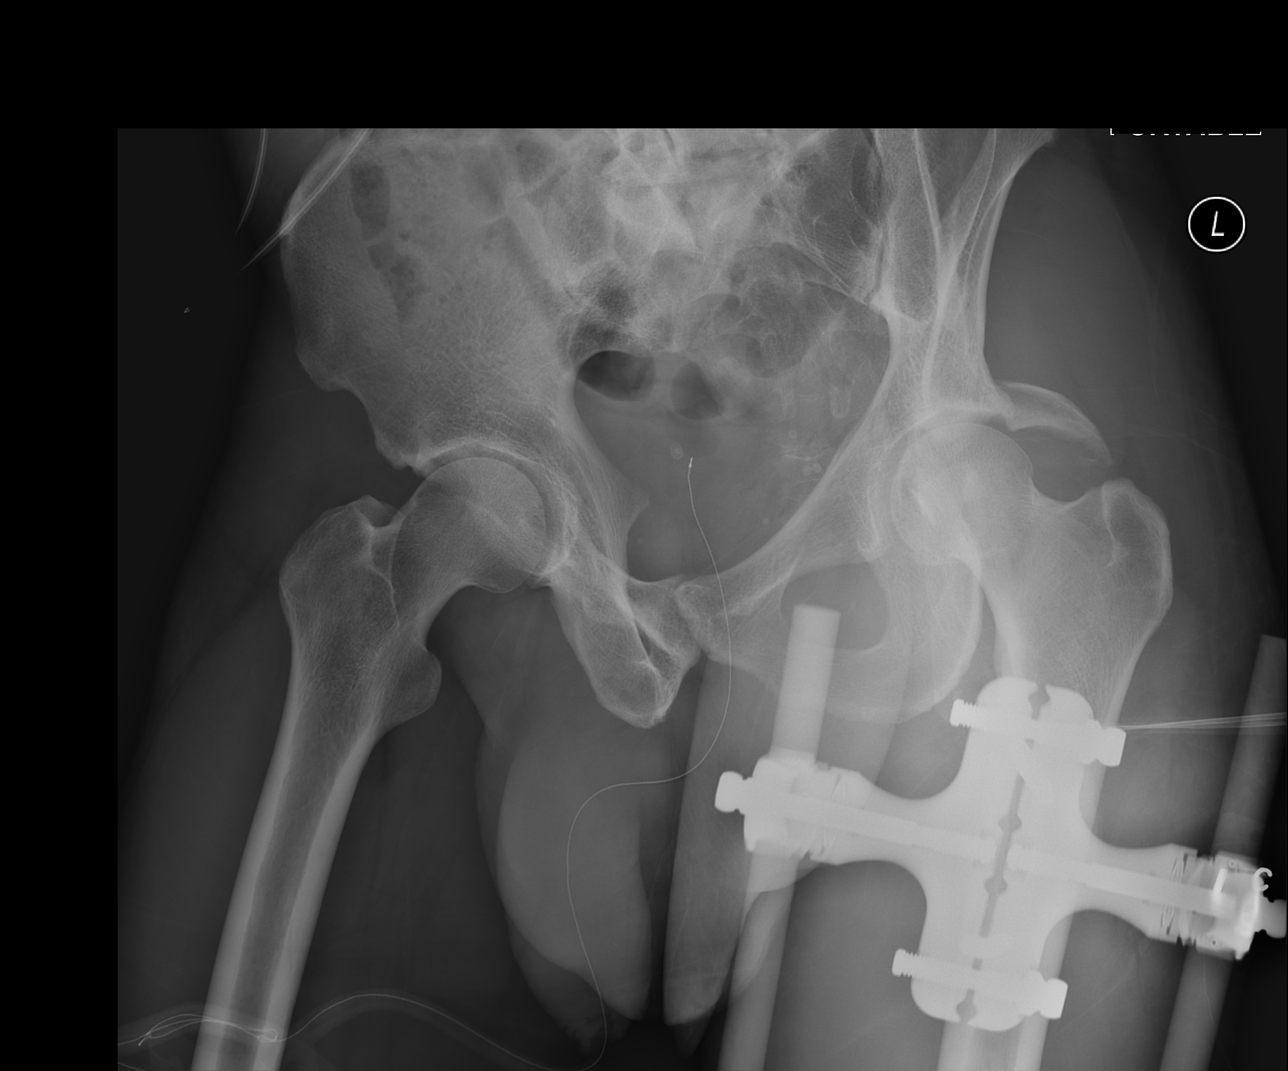

[3 of 3 positions shown; findings below may reference images not displayed]

FINDINGS: Ex fix device transfixing the proximal left femur. Comminuted left
acetabular fracture with mild lateral displacement of the superior
posterior fracture fragment. Nondisplaced fracture through the
medial wall of the left acetabulum. No hip dislocation. SI joints
are congruent. No right hip fracture or dislocation. Pubic symphysis
is normal.
IMPRESSION: Comminuted left acetabular fracture. Mild lateral displacement of
the superior posterior fracture fragment. Nondisplaced fracture
through the medial wall of the left acetabulum. No left hip
dislocation.

## 2016-11-08 ENCOUNTER — Encounter (HOSPITAL_COMMUNITY): Payer: Self-pay | Admitting: *Deleted

## 2016-11-08 ENCOUNTER — Emergency Department (HOSPITAL_COMMUNITY)
Admission: EM | Admit: 2016-11-08 | Discharge: 2016-11-08 | Disposition: A | Discharge status: Home / Self Care | Payer: 59 | Attending: Emergency Medicine | Admitting: Emergency Medicine

## 2016-11-08 ENCOUNTER — Emergency Department (HOSPITAL_COMMUNITY): Payer: 59

## 2016-11-08 DIAGNOSIS — S51011A Laceration without foreign body of right elbow, initial encounter: Secondary | ICD-10-CM

## 2016-11-08 DIAGNOSIS — Y999 Unspecified external cause status: Secondary | ICD-10-CM | POA: Insufficient documentation

## 2016-11-08 DIAGNOSIS — S52501A Unspecified fracture of the lower end of right radius, initial encounter for closed fracture: Secondary | ICD-10-CM | POA: Insufficient documentation

## 2016-11-08 DIAGNOSIS — W228XXA Striking against or struck by other objects, initial encounter: Secondary | ICD-10-CM | POA: Insufficient documentation

## 2016-11-08 DIAGNOSIS — Z79899 Other long term (current) drug therapy: Secondary | ICD-10-CM | POA: Diagnosis not present

## 2016-11-08 DIAGNOSIS — Y929 Unspecified place or not applicable: Secondary | ICD-10-CM | POA: Insufficient documentation

## 2016-11-08 DIAGNOSIS — Y9389 Activity, other specified: Secondary | ICD-10-CM | POA: Insufficient documentation

## 2016-11-08 DIAGNOSIS — S59811A Other specified injuries right forearm, initial encounter: Secondary | ICD-10-CM | POA: Diagnosis present

## 2016-11-08 DIAGNOSIS — F17210 Nicotine dependence, cigarettes, uncomplicated: Secondary | ICD-10-CM | POA: Insufficient documentation

## 2016-11-08 MED ORDER — OXYCODONE-ACETAMINOPHEN 5-325 MG PO TABS
1.0000 | ORAL_TABLET | Freq: Once | ORAL | Status: AC
  Administered 2016-11-08: 1 via ORAL
  Filled 2016-11-08: qty 1

## 2016-11-08 MED ORDER — TETANUS-DIPHTH-ACELL PERTUSSIS 5-2.5-18.5 LF-MCG/0.5 IM SUSP
0.5000 mL | Freq: Once | INTRAMUSCULAR | Status: DC
  Filled 2016-11-08: qty 0.5

## 2016-11-08 MED ORDER — OXYCODONE-ACETAMINOPHEN 5-325 MG PO TABS: 1.0000 | ORAL_TABLET | Freq: Four times a day (QID) | ORAL | 0 refills | Status: DC | PRN

## 2016-11-08 MED ORDER — LIDOCAINE-EPINEPHRINE (PF) 2 %-1:200000 IJ SOLN
20.0000 mL | Freq: Once | INTRAMUSCULAR | Status: AC
  Administered 2016-11-08: 20 mL
  Filled 2016-11-08: qty 20

## 2016-11-08 NOTE — Discharge Instructions (Signed)
You have a fracture in your right forearm (radius). I have placed you in a splint and sling. Do not remove splint until being seen by orthopedics. Please call and schedule an appointment with ortho in the next 24 hours. I have attached the phone number below. If you start getting purpling of the hand, extreme pain, red streaking up arm, fever please return to the emergency department for re-evaluation. Please follow up with PCP in 7-10 days to have stitching removed. If you do not have a PCP you can be seen at the wellness center (information attached) or [facility].  For pain control you may take:  800mg  of ibuprofen (that is usually 4 over the counter pills)  3 times a day (take with food) and acetaminophen 975mg  (this is 3 over the counter pills) four times a day. Do not drink alcohol or combine with other medications that have acetaminophen as an ingredient (Read the labels!).  For breakthrough pain you may take Percocet. Do not drink alcohol drive or operate heavy machinery when taking Percocet. RICE (Rest-ice-compression-elevation) information attached.   You can return to the emergency department for worsening or new concerning symptoms.

## 2016-11-08 NOTE — Progress Notes (Signed)
Orthopedic Tech Progress Note Patient Details:  [name] HaneRichard A [name] 08/03/1965 [id number]  Ortho Devices Type of Ortho Device: Ace wrap, Arm sling, Sugartong splint Ortho Device/Splint Location: rue Ortho Device/Splint Interventions: Application   [name] 11/08/2016, 1:59 PM
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
## 2016-11-08 NOTE — ED Notes (Signed)
Patient transported to X-ray 

## 2016-11-08 NOTE — ED Notes (Signed)
Pt returned from xray

## 2016-11-08 NOTE — ED Triage Notes (Signed)
Working on car yesterday and jack and then it came down on right forearm and right elbow with a lac, bleeding controlled. Radial pulse present

## 2016-11-10 NOTE — H&P (Signed)
[name] [name] is an 51 y.o. male.   Chief Complaint: Right forearm fracture HPI: [name] [name] is a 51 y.o. male who presents to the emergency department today after injuring himself while working on a car yesterday night. The patient states he was working on a car attempting to take a tire off when the jack failed, causing the rim of the wheel to be deflected onto his right mid forearm. The patient reactively jerked his arm away but hit the back of his right elblow on the fender causing a laceration. The patient is right hand dominant. The bleeding is well controlled and bandaged. He is now having pain over the right mid forearm that is worse with pronation and supination. Some reported swelling, erythema, numbness over area of injury. No fever, chills, weakness, tingling. Last Tetanus 2016. Patient was seen and evaluated in the office today.  Past Medical History:  Diagnosis Date  . Fracture of great toe, left, closed   . H/O hernia repair   . H/O repair of rotator cuff   . 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 Testosterone deficiency 11/13/2014  . Umbilical hernia     Past Surgical History:  Procedure Laterality Date  . CAPSULOTOMY Left 11/12/2014   Procedure: LEFT KNEE MANIPULATION , CAPSULOTOMY;  Surgeon: Myrene GalasMichael Handy, MD;  [facility];  Service: Orthopedics;  Laterality: Left;  . EXCISIONAL TOTAL HIP ARTHROPLASTY WITH ANTIBIOTIC SPACERS Left 11/12/2014   Procedure: REMOVAL OF ANTIBIOTIC SPACERS;  Surgeon: Myrene GalasMichael Handy, MD;  [facility];  Service: Orthopedics;  Laterality: Left;  . EXTERNAL FIXATION LEG Left 09/30/2014   Procedure: EXTERNAL FIXATION LEG;  Surgeon: Samson FredericBrian Swinteck, MD;  Location: [facility] OR;  Service: Orthopedics;  Laterality: Left;  . HERNIA REPAIR    . HIP CLOSED REDUCTION Left 09/30/2014   Procedure: CLOSED REDUCTION HIP;  Surgeon: Samson FredericBrian Swinteck, MD;  Location: [facility] OR;  Service: Orthopedics;  Laterality: Left;  . I&D EXTREMITY Left 09/30/2014   Procedure: IRRIGATION AND  DEBRIDEMENT EXTREMITY;  Surgeon: Samson FredericBrian Swinteck, MD;  Location: [facility] OR;  Service: Orthopedics;  Laterality: Left;  . I&D EXTREMITY Left 10/02/2014   Procedure: IRRIGATION AND DEBRIDEMENT LEFT FEMUR FRACTURE;  Surgeon: Myrene GalasMichael Handy, MD;  [facility];  Service: Orthopedics;  Laterality: Left;  . ORIF ACETABULAR FRACTURE Left 10/02/2014   Procedure: OPEN REDUCTION INTERNAL FIXATION (ORIF) LEFT ACETABULAR FRACTURE;  Surgeon: Myrene GalasMichael Handy, MD;  [facility];  Service: Orthopedics;  Laterality: Left;  . ORIF FEMUR FRACTURE Left 11/12/2014   Procedure: LEFT FEMUR NON UNION REPAIR ;  Surgeon: Myrene GalasMichael Handy, MD;  [facility];  Service: Orthopedics;  Laterality: Left;  . ROTATOR CUFF REPAIR      Family History  Problem Relation Age of Onset  . Diabetes Mother    Social History:  reports that he has been smoking Cigarettes.  He has been smoking about 0.50 packs per day. He has never used smokeless tobacco. He reports that he does not drink alcohol or use drugs.  Allergies: No Known Allergies  No prescriptions prior to admission.    No results found for this or any previous visit (from the past 48 hour(s)). No results found.  ROS no recent illnesses or hospitalizations  There were no vitals taken for this visit. Physical Exam  General Appearance:  Alert, cooperative, no distress, appears stated age  Head:  Normocephalic, without obvious abnormality, atraumatic  Eyes:  Pupils equal, conjunctiva/corneas clear,         Throat: Lips, mucosa,  and tongue normal; teeth and gums normal  Neck: No visible masses     Lungs:   respirations unlabored  Chest Wall:  No tenderness or deformity  Heart:  Regular rate and rhythm,  Abdomen:   Soft, non-tender,         Extremities: Patient is a sugar tong splint  able extend his thumb extend his digits his fingertips are warm well perfused good capillary refill  Pulses: 2+ and symmetric  Skin: Skin color, texture, turgor normal, no rashes or  lesions     Neurologic: Normal    Assessment/Plan Right radial shaft fracture, displaced  Right radial shaft fracture open reduction and internal fixation and repair as indicated  R/B/A DISCUSSED WITH PT IN OFFICE.  PT VOICED UNDERSTANDING OF PLAN CONSENT SIGNED DAY OF SURGERY PT SEEN AND EXAMINED PRIOR TO OPERATIVE PROCEDURE/DAY OF SURGERY SITE MARKED. QUESTIONS ANSWERED WILL GO HOME FOLLOWING SURGERY  WE ARE PLANNING SURGERY FOR YOUR UPPER EXTREMITY. THE RISKS AND BENEFITS OF SURGERY INCLUDE BUT NOT LIMITED TO BLEEDING INFECTION, DAMAGE TO NEARBY NERVES ARTERIES TENDONS, FAILURE OF SURGERY TO ACCOMPLISH ITS INTENDED GOALS, PERSISTENT SYMPTOMS AND NEED FOR FURTHER SURGICAL INTERVENTION. WITH THIS IN MIND WE WILL PROCEED. I HAVE DISCUSSED WITH THE PATIENT THE PRE AND POSTOPERATIVE REGIMEN AND THE DOS AND DON'TS. PT VOICED UNDERSTANDING AND INFORMED CONSENT SIGNED.  [name] 11/10/2016, 8:14 PM

## 2016-11-10 NOTE — Progress Notes (Signed)
Pre-op instructions left on pt voice mailbox according to pre-op checklist. Please complete assessment on DOS. Pt made aware to stop taking  Aspirin, vitamins, fish oil and herbal medications. Do not take any NSAIDs ie: Ibuprofen, Advil, Naproxen, BC and Goody Powder .

## 2016-11-11 ENCOUNTER — Ambulatory Visit (HOSPITAL_COMMUNITY): Payer: 59 | Admitting: Certified Registered Nurse Anesthetist

## 2016-11-11 ENCOUNTER — Encounter (HOSPITAL_COMMUNITY): Payer: Self-pay | Admitting: *Deleted

## 2016-11-11 ENCOUNTER — Ambulatory Visit (HOSPITAL_COMMUNITY)
Admission: RE | Admit: 2016-11-11 | Discharge: 2016-11-11 | Disposition: A | Discharge status: Home / Self Care | Payer: 59 | Source: Ambulatory Visit | Attending: Orthopedic Surgery | Admitting: Orthopedic Surgery

## 2016-11-11 ENCOUNTER — Encounter (HOSPITAL_COMMUNITY)
Admission: RE | Disposition: A | Discharge status: Home / Self Care | Payer: Self-pay | Source: Ambulatory Visit | Attending: Orthopedic Surgery

## 2016-11-11 DIAGNOSIS — E291 Testicular hypofunction: Secondary | ICD-10-CM | POA: Insufficient documentation

## 2016-11-11 DIAGNOSIS — Z9889 Other specified postprocedural states: Secondary | ICD-10-CM | POA: Insufficient documentation

## 2016-11-11 DIAGNOSIS — S52351A Displaced comminuted fracture of shaft of radius, right arm, initial encounter for closed fracture: Secondary | ICD-10-CM | POA: Insufficient documentation

## 2016-11-11 DIAGNOSIS — Z96642 Presence of left artificial hip joint: Secondary | ICD-10-CM | POA: Insufficient documentation

## 2016-11-11 DIAGNOSIS — F17210 Nicotine dependence, cigarettes, uncomplicated: Secondary | ICD-10-CM | POA: Insufficient documentation

## 2016-11-11 DIAGNOSIS — W228XXA Striking against or struck by other objects, initial encounter: Secondary | ICD-10-CM | POA: Diagnosis not present

## 2016-11-11 DIAGNOSIS — Z833 Family history of diabetes mellitus: Secondary | ICD-10-CM | POA: Diagnosis not present

## 2016-11-11 DIAGNOSIS — S52301A Unspecified fracture of shaft of right radius, initial encounter for closed fracture: Secondary | ICD-10-CM

## 2016-11-11 HISTORY — PX: OPEN REDUCTION INTERNAL FIXATION (ORIF) DISTAL RADIAL FRACTURE: SHX5989

## 2016-11-11 SURGERY — OPEN REDUCTION INTERNAL FIXATION (ORIF) DISTAL RADIUS FRACTURE
Anesthesia: General | Site: Arm Lower | Laterality: Right

## 2016-11-11 MED ORDER — DEXAMETHASONE SODIUM PHOSPHATE 10 MG/ML IJ SOLN
INTRAMUSCULAR | Status: DC | PRN
  Administered 2016-11-11: 10 mg via INTRAVENOUS

## 2016-11-11 MED ORDER — ONDANSETRON HCL 4 MG/2ML IJ SOLN
INTRAMUSCULAR | Status: DC | PRN
  Administered 2016-11-11: 4 mg via INTRAVENOUS

## 2016-11-11 MED ORDER — 0.9 % SODIUM CHLORIDE (POUR BTL) OPTIME
TOPICAL | Status: DC | PRN
  Administered 2016-11-11: 1000 mL

## 2016-11-11 MED ORDER — LIDOCAINE 2% (20 MG/ML) 5 ML SYRINGE
INTRAMUSCULAR | Status: AC
  Filled 2016-11-11: qty 10

## 2016-11-11 MED ORDER — ONDANSETRON HCL 4 MG/2ML IJ SOLN: 4.0000 mg | Freq: Once | INTRAMUSCULAR | Status: DC | PRN

## 2016-11-11 MED ORDER — OXYCODONE HCL 5 MG/5ML PO SOLN: 5.0000 mg | Freq: Once | ORAL | Status: DC | PRN

## 2016-11-11 MED ORDER — MIDAZOLAM HCL 2 MG/2ML IJ SOLN
INTRAMUSCULAR | Status: AC
  Filled 2016-11-11: qty 2

## 2016-11-11 MED ORDER — MIDAZOLAM HCL 2 MG/2ML IJ SOLN
2.0000 mg | Freq: Once | INTRAMUSCULAR | Status: AC
  Administered 2016-11-11: 2 mg via INTRAVENOUS

## 2016-11-11 MED ORDER — ONDANSETRON HCL 4 MG/2ML IJ SOLN
INTRAMUSCULAR | Status: AC
  Filled 2016-11-11: qty 4

## 2016-11-11 MED ORDER — FENTANYL CITRATE (PF) 100 MCG/2ML IJ SOLN
100.0000 ug | Freq: Once | INTRAMUSCULAR | Status: AC
  Administered 2016-11-11: 100 ug via INTRAVENOUS

## 2016-11-11 MED ORDER — CEFAZOLIN SODIUM-DEXTROSE 2-4 GM/100ML-% IV SOLN
INTRAVENOUS | Status: AC
  Filled 2016-11-11: qty 100

## 2016-11-11 MED ORDER — CEFAZOLIN SODIUM-DEXTROSE 2-4 GM/100ML-% IV SOLN
2.0000 g | INTRAVENOUS | Status: AC
  Administered 2016-11-11: 2 g via INTRAVENOUS

## 2016-11-11 MED ORDER — FENTANYL CITRATE (PF) 250 MCG/5ML IJ SOLN
INTRAMUSCULAR | Status: AC
  Filled 2016-11-11: qty 5

## 2016-11-11 MED ORDER — LACTATED RINGERS IV SOLN
INTRAVENOUS | Status: DC
  Administered 2016-11-11 (×4): via INTRAVENOUS

## 2016-11-11 MED ORDER — FENTANYL CITRATE (PF) 100 MCG/2ML IJ SOLN: 25.0000 ug | INTRAMUSCULAR | Status: DC | PRN

## 2016-11-11 MED ORDER — CHLORHEXIDINE GLUCONATE 4 % EX LIQD: 60.0000 mL | Freq: Once | CUTANEOUS | Status: DC

## 2016-11-11 MED ORDER — PHENYLEPHRINE HCL 10 MG/ML IJ SOLN
INTRAMUSCULAR | Status: DC | PRN
  Administered 2016-11-11: 120 ug via INTRAVENOUS
  Administered 2016-11-11: 80 ug via INTRAVENOUS
  Administered 2016-11-11: 120 ug via INTRAVENOUS
  Administered 2016-11-11: 40 ug via INTRAVENOUS

## 2016-11-11 MED ORDER — MIDAZOLAM HCL 2 MG/2ML IJ SOLN
INTRAMUSCULAR | Status: AC
  Administered 2016-11-11: 2 mg via INTRAVENOUS
  Filled 2016-11-11: qty 2

## 2016-11-11 MED ORDER — OXYCODONE HCL 5 MG PO TABS: 5.0000 mg | ORAL_TABLET | Freq: Once | ORAL | Status: DC | PRN

## 2016-11-11 MED ORDER — BUPIVACAINE HCL (PF) 0.25 % IJ SOLN
INTRAMUSCULAR | Status: AC
  Filled 2016-11-11: qty 30

## 2016-11-11 MED ORDER — DEXAMETHASONE SODIUM PHOSPHATE 10 MG/ML IJ SOLN
INTRAMUSCULAR | Status: AC
  Filled 2016-11-11: qty 2

## 2016-11-11 MED ORDER — FENTANYL CITRATE (PF) 100 MCG/2ML IJ SOLN
INTRAMUSCULAR | Status: AC
  Administered 2016-11-11: 100 ug via INTRAVENOUS
  Filled 2016-11-11: qty 2

## 2016-11-11 MED ORDER — EPHEDRINE SULFATE 50 MG/ML IJ SOLN
INTRAMUSCULAR | Status: DC | PRN
  Administered 2016-11-11: 5 mg via INTRAVENOUS
  Administered 2016-11-11 (×2): 10 mg via INTRAVENOUS
  Administered 2016-11-11: 5 mg via INTRAVENOUS

## 2016-11-11 MED ORDER — MIDAZOLAM HCL 5 MG/5ML IJ SOLN
INTRAMUSCULAR | Status: DC | PRN
  Administered 2016-11-11: 1 mg via INTRAVENOUS

## 2016-11-11 MED ORDER — PROPOFOL 10 MG/ML IV BOLUS
INTRAVENOUS | Status: DC | PRN
  Administered 2016-11-11: 190 mg via INTRAVENOUS

## 2016-11-11 MED ORDER — PROPOFOL 10 MG/ML IV BOLUS
INTRAVENOUS | Status: AC
  Filled 2016-11-11: qty 20

## 2016-11-11 SURGICAL SUPPLY — 53 items
BANDAGE ACE 3X5.8 VEL STRL LF (GAUZE/BANDAGES/DRESSINGS) ×3 IMPLANT
BANDAGE ACE 4X5 VEL STRL LF (GAUZE/BANDAGES/DRESSINGS) ×3 IMPLANT
BANDAGE ELASTIC 4 VELCRO ST LF (GAUZE/BANDAGES/DRESSINGS) ×4 IMPLANT
BIT DRILL 2.5X2.75 QC CALB (BIT) ×2 IMPLANT
BLADE CLIPPER SURG (BLADE) IMPLANT
BNDG CMPR 9X4 STRL LF SNTH (GAUZE/BANDAGES/DRESSINGS) ×1
BNDG ESMARK 4X9 LF (GAUZE/BANDAGES/DRESSINGS) ×3 IMPLANT
BNDG GAUZE ELAST 4 BULKY (GAUZE/BANDAGES/DRESSINGS) ×3 IMPLANT
CANISTER SUCT 3000ML PPV (MISCELLANEOUS) ×3 IMPLANT
CORDS BIPOLAR (ELECTRODE) ×3 IMPLANT
COVER SURGICAL LIGHT HANDLE (MISCELLANEOUS) ×3 IMPLANT
CUFF TOURNIQUET SINGLE 18IN (TOURNIQUET CUFF) ×3 IMPLANT
CUFF TOURNIQUET SINGLE 24IN (TOURNIQUET CUFF) IMPLANT
DECANTER SPIKE VIAL GLASS SM (MISCELLANEOUS) ×3 IMPLANT
DRAPE OEC MINIVIEW 54X84 (DRAPES) ×3 IMPLANT
DRAPE SURG 17X11 SM STRL (DRAPES) ×3 IMPLANT
DRSG ADAPTIC 3X8 NADH LF (GAUZE/BANDAGES/DRESSINGS) ×3 IMPLANT
GAUZE SPONGE 4X4 12PLY STRL (GAUZE/BANDAGES/DRESSINGS) ×3 IMPLANT
GAUZE SPONGE 4X4 12PLY STRL LF (GAUZE/BANDAGES/DRESSINGS) ×2 IMPLANT
GAUZE SPONGE 4X4 16PLY XRAY LF (GAUZE/BANDAGES/DRESSINGS) ×3 IMPLANT
GLOVE BIOGEL PI IND STRL 8.5 (GLOVE) ×1 IMPLANT
GLOVE BIOGEL PI INDICATOR 8.5 (GLOVE) ×2
GLOVE SURG ORTHO 8.0 STRL STRW (GLOVE) ×3 IMPLANT
GOWN STRL REUS W/ TWL LRG LVL3 (GOWN DISPOSABLE) ×1 IMPLANT
GOWN STRL REUS W/ TWL XL LVL3 (GOWN DISPOSABLE) ×1 IMPLANT
GOWN STRL REUS W/TWL LRG LVL3 (GOWN DISPOSABLE) ×3
GOWN STRL REUS W/TWL XL LVL3 (GOWN DISPOSABLE) ×3
KIT BASIN OR (CUSTOM PROCEDURE TRAY) ×3 IMPLANT
KIT ROOM TURNOVER OR (KITS) ×3 IMPLANT
NDL HYPO 25X1 1.5 SAFETY (NEEDLE) ×1 IMPLANT
NEEDLE HYPO 25X1 1.5 SAFETY (NEEDLE) ×3 IMPLANT
NS IRRIG 1000ML POUR BTL (IV SOLUTION) ×3 IMPLANT
PACK ORTHO EXTREMITY (CUSTOM PROCEDURE TRAY) ×3 IMPLANT
PAD ARMBOARD 7.5X6 YLW CONV (MISCELLANEOUS) ×6 IMPLANT
PAD CAST 3X4 CTTN HI CHSV (CAST SUPPLIES) IMPLANT
PAD CAST 4YDX4 CTTN HI CHSV (CAST SUPPLIES) ×1 IMPLANT
PADDING CAST COTTON 3X4 STRL (CAST SUPPLIES) ×3
PADDING CAST COTTON 4X4 STRL (CAST SUPPLIES) ×3
PLATE LOCK COMP 6H FOOT (Plate) ×2 IMPLANT
SCREW CORTICAL 3.5MM  16MM (Screw) ×10 IMPLANT
SCREW CORTICAL 3.5MM 16MM (Screw) IMPLANT
SCREW CORTICAL 3.5MM 18MM (Screw) ×2 IMPLANT
SOAP 2 % CHG 4 OZ (WOUND CARE) ×3 IMPLANT
SPONGE LAP 4X18 X RAY DECT (DISPOSABLE) ×3 IMPLANT
SUT VIC AB 2-0 FS1 27 (SUTURE) IMPLANT
SUT VICRYL 4-0 PS2 18IN ABS (SUTURE) IMPLANT
SYR CONTROL 10ML LL (SYRINGE) IMPLANT
TOWEL OR 17X24 6PK STRL BLUE (TOWEL DISPOSABLE) ×3 IMPLANT
TOWEL OR 17X26 10 PK STRL BLUE (TOWEL DISPOSABLE) ×3 IMPLANT
TUBE CONNECTING 12'X1/4 (SUCTIONS) ×1
TUBE CONNECTING 12X1/4 (SUCTIONS) ×2 IMPLANT
WATER STERILE IRR 1000ML POUR (IV SOLUTION) ×3 IMPLANT
YANKAUER SUCT BULB TIP NO VENT (SUCTIONS) IMPLANT

## 2016-11-11 NOTE — Progress Notes (Signed)
Patient remains alert and oriented. NSR on monitor, SPO2 100% Valuables sent to security.

## 2016-11-11 NOTE — Discharge Instructions (Signed)
KEEP BANDAGE CLEAN AND DRY °CALL OFFICE FOR F/U APPT [phone] in 14 days °KEEP HAND ELEVATED ABOVE HEART °OK TO APPLY ICE TO OPERATIVE AREA °CONTACT OFFICE IF ANY WORSENING PAIN OR CONCERNS. °

## 2016-11-11 NOTE — Anesthesia Postprocedure Evaluation (Signed)
Anesthesia Post Note  Patient: [name] [name]  Procedure(s) Performed: Procedure(s) (LRB): Open reduction internal fixation right radial shaft fracture (Right)     Patient location during evaluation: PACU Anesthesia Type: General and Regional Level of consciousness: awake, oriented and awake and alert Pain management: pain level controlled Respiratory status: spontaneous breathing, nonlabored ventilation and respiratory function stable Cardiovascular status: blood pressure returned to baseline Anesthetic complications: no    Last Vitals:  Vitals:   11/11/16 1136 11/11/16 1151  BP: 139/79 137/79  Pulse: 70 73  Resp: 17 16  Temp:  36.5 C    Last Pain:  Vitals:   11/11/16 0827  TempSrc:   PainSc: 8                  Shrihan Putt COKER

## 2016-11-11 NOTE — Transfer of Care (Signed)
Immediate Anesthesia Transfer of Care Note  Patient: [name] [name]  Procedure(s) Performed: Procedure(s) with comments: Open reduction internal fixation right radial shaft fracture (Right) - 90 mins  Patient Location: PACU  Anesthesia Type:General  Level of Consciousness: awake, oriented and patient cooperative  Airway & Oxygen Therapy: Patient Spontanous Breathing and Patient connected to nasal cannula oxygen  Post-op Assessment: Report given to RN and Post -op Vital signs reviewed and stable  Post vital signs: Reviewed and stable  Last Vitals:  Vitals:   11/11/16 0855 11/11/16 0859  BP:  (!) 118/45  Pulse: 81 88  Resp: 17 14  Temp:      Last Pain:  Vitals:   11/11/16 0827  TempSrc:   PainSc: 8       Patients Stated Pain Goal: 4 (11/11/16 0827)  Complications: No apparent anesthesia complications

## 2016-11-11 NOTE — Op Note (Signed)
PREOPERATIVE DIAGNOSIS: Right radial shaft fracture, displaced  POSTOPERATIVE DIAGNOSIS: Displaced comminuted right radial shaft fracture with internal fixation  #2: Radiographs 3 views right forearm  ATTENDING SURGEON: Dr, Bradly BienenstockFred Amoreena [name] is scrubbed and present for the entire procedure  ASSISTANT SURGEON: None  ANESTHESIA: Gen. via laryngeal mask airway, block performed by anesthesia  OPERATIVE PROCEDURE: Open treatment of right radial shaft fracture requiring internal fixation Radiographs 3 views right forearm  IMPLANTS: Biomet 6-hole DCP plate with 3 screws proximally 3 screws distally, 3.5  RADIOGRAPHIC INTERPRETATION: AP lateral oblique views of the forearm do show the internal fixation place with anatomical alignment of the radial shaft.  SURGICAL INDICATIONS: Patient is a right-hand-dominant gentleman who sustained a closed right radial shaft fracture. Patient was seen and evaluated in the office and recommended to undergo the above procedure. Risks of surgery include but not limited to bleeding infection damage to nearby nerves arteries or tendons loss of motion of the wrists and digits incomplete relief of symptoms nonunion malunion and need for further surgical intervention  SURGICAL TECHNIQUE: Patient was properly identified in the preoperative holding area marked for permanent marker made on the right performed indicate correct operative site. Patient was then brought back to the operating room placed supine on anesthesia and table where general anesthesia was administered. A well-padded tourniquet was then placed on the right brachium and sealed with a 1000 drape. The right upper extremity was then prepped and draped in normal sterile fashion. Preoperative antibiotics had been given prior to any skin incision. A longitudinal incision made directly over the radial shaft. Dissection was carried down through the skin and subcutaneous tissue at the tourniquet insufflated. The FCR sheath  was then opened and going bluntly down to the fracture site the fracture site was then opened up and the fracture hematoma was evacuated. Once this is carried out the patient did have a small cortical fracture hindering reduction and this is removed out of the fracture site. Open reduction was then applied. The 6-hole plate was then properly contoured with the appropriate bend to the plate to the long the lines of the radial bone. This held in place with reduction clamps. Once this held with reduction clamps screw fixation was achieved with 3 screws proximally 3 screws distally 35 bicortical screws. The wound was then thoroughly irrigated. Final radiographs of the forearm are then obtained. Subtendinous tissue then closed with 4-0 Vicryl and skin closed with 3-0 Prolene. Adaptic dressing sterile compressive bandage then applied. Patient is then placed in a well-padded sugar tong splint. He was extubated and taken recovery room in good condition.  POSTOPERATIVE PLAN: Patient be discharged to home seen back in the office in approximately 2 weeks for wound check suture removal x-rays down to see her therapist first volar forearm fracture brace begin some gentle active range of motion see him at the 2 week mark 5 week mark 8 week mark radiographs at each visit. He will likely be out of work for at least the first 5 weeks given the demands that he has for his wrist and forearm.

## 2016-11-11 NOTE — Anesthesia Procedure Notes (Signed)
Anesthesia Regional Block: Supraclavicular block   Pre-Anesthetic Checklist: ,, timeout performed, Correct Patient, Correct Site, Correct Laterality, Correct Procedure, Correct Position, site marked, Risks and benefits discussed,  Surgical consent,  Pre-op evaluation,  At surgeon's request and post-op pain management  Laterality: Right  Prep: chloraprep       Needles:  Injection technique: Single-shot  Needle Type: Echogenic Stimulator Needle          Additional Needles:   Procedures: ultrasound guided,,,,,,,,  Narrative:  Start time: 11/11/2016 8:45 AM End time: 11/11/2016 8:50 AM Injection made incrementally with aspirations every 5 mL.  Performed by: Personally  Anesthesiologist: Andyn Sales  Additional Notes: 30 cc 0.5% Bupivacaine with 1:200 epi injected easily

## 2016-11-11 NOTE — Anesthesia Procedure Notes (Signed)
Procedure Name: LMA Insertion Date/Time: 11/11/2016 9:50 AM Performed by: Julianne RiceBILOTTA, Hyacinth Marcelli Z Pre-anesthesia Checklist: Patient identified, Emergency Drugs available, Suction available and Patient being monitored Patient Re-evaluated:Patient Re-evaluated prior to inductionOxygen Delivery Method: Circle System Utilized Preoxygenation: Pre-oxygenation with 100% oxygen Intubation Type: IV induction Ventilation: Mask ventilation without difficulty LMA: LMA inserted LMA Size: 4.0 Number of attempts: 1 Placement Confirmation: positive ETCO2 Tube secured with: Tape Dental Injury: Teeth and Oropharynx as per pre-operative assessment

## 2016-11-11 NOTE — Progress Notes (Signed)
Orthopedic Tech Progress Note Patient Details:  [name] HaneRichard A [name] 01/17/1966 [id number]  Ortho Devices Type of Ortho Device: Arm sling   [name] 11/11/2016, 11:38 AM

## 2016-11-11 NOTE — Anesthesia Preprocedure Evaluation (Addendum)
Anesthesia Evaluation  Patient identified by MRN, date of birth, ID band Patient awake    Reviewed: Allergy & Precautions, NPO status , Patient's Chart, lab work & pertinent test results  Airway Mallampati: II  TM Distance: >3 FB Neck ROM: Full    Dental  (+) Teeth Intact, Dental Advisory Given   Pulmonary Current Smoker,    breath sounds clear to auscultation       Cardiovascular  Rhythm:Regular Rate:Normal     Neuro/Psych    GI/Hepatic   Endo/Other    Renal/GU      Musculoskeletal   Abdominal   Peds  Hematology   Anesthesia Other Findings   Reproductive/Obstetrics                            Anesthesia Physical Anesthesia Plan  ASA: II  Anesthesia Plan: General   Post-op Pain Management:  Regional for Post-op pain   Induction: Intravenous  PONV Risk Score and Plan: 1 and Ondansetron and Dexamethasone  Airway Management Planned: LMA  Additional Equipment:   Intra-op Plan:   Post-operative Plan:   Informed Consent: I have reviewed the patients History and Physical, chart, labs and discussed the procedure including the risks, benefits and alternatives for the proposed anesthesia with the patient or authorized representative who has indicated his/her understanding and acceptance.     Plan Discussed with: CRNA and Anesthesiologist  Anesthesia Plan Comments:         Anesthesia Quick Evaluation

## 2016-11-11 NOTE — Anesthesia Procedure Notes (Signed)
Performed by: Nafeesah Lapaglia Z       

## 2016-11-13 ENCOUNTER — Encounter (HOSPITAL_COMMUNITY): Payer: Self-pay | Admitting: Orthopedic Surgery

## 2018-08-14 ENCOUNTER — Other Ambulatory Visit: Payer: Self-pay

## 2018-08-14 ENCOUNTER — Encounter: Payer: Self-pay | Admitting: Emergency Medicine

## 2018-08-14 ENCOUNTER — Emergency Department (HOSPITAL_COMMUNITY): Payer: 59

## 2018-08-14 ENCOUNTER — Encounter (HOSPITAL_COMMUNITY): Payer: Self-pay | Admitting: Emergency Medicine

## 2018-08-14 ENCOUNTER — Emergency Department (HOSPITAL_COMMUNITY)
Admission: EM | Admit: 2018-08-14 | Discharge: 2018-08-15 | Disposition: A | Discharge status: Home / Self Care | Payer: 59 | Attending: Emergency Medicine | Admitting: Emergency Medicine

## 2018-08-14 ENCOUNTER — Ambulatory Visit
Admission: EM | Admit: 2018-08-14 | Discharge: 2018-08-14 | Disposition: A | Discharge status: Home / Self Care | Payer: 59 | Source: Home / Self Care | Attending: Emergency Medicine | Admitting: Emergency Medicine

## 2018-08-14 DIAGNOSIS — R000 Tachycardia, unspecified: Secondary | ICD-10-CM

## 2018-08-14 DIAGNOSIS — R079 Chest pain, unspecified: Secondary | ICD-10-CM

## 2018-08-14 DIAGNOSIS — F17210 Nicotine dependence, cigarettes, uncomplicated: Secondary | ICD-10-CM | POA: Diagnosis not present

## 2018-08-14 DIAGNOSIS — R42 Dizziness and giddiness: Secondary | ICD-10-CM | POA: Diagnosis present

## 2018-08-14 LAB — COMPREHENSIVE METABOLIC PANEL
ALT: 17 U/L (ref 0–44)
AST: 122 U/L — ABNORMAL HIGH (ref 15–41)
Albumin: 3.5 g/dL (ref 3.5–5.0)
Alkaline Phosphatase: 54 U/L (ref 38–126)
Anion gap: 8 (ref 5–15)
BILIRUBIN TOTAL: 0.5 mg/dL (ref 0.3–1.2)
BUN: 9 mg/dL (ref 6–20)
CO2: 25 mmol/L (ref 22–32)
Calcium: 8.8 mg/dL — ABNORMAL LOW (ref 8.9–10.3)
Chloride: 105 mmol/L (ref 98–111)
Creatinine, Ser: 0.84 mg/dL (ref 0.61–1.24)
GFR calc Af Amer: >60 mL/min (ref >60)
GFR calc non Af Amer: >60 mL/min (ref >60)
Glucose, Bld: 87 mg/dL (ref 70–99)
Potassium: 3.8 mmol/L (ref 3.5–5.1)
Sodium: 138 mmol/L (ref 135–145)
TOTAL PROTEIN: 6.4 g/dL — AB (ref 6.5–8.1)

## 2018-08-14 LAB — CBC WITH DIFFERENTIAL/PLATELET
Abs Immature Granulocytes: 0.03 10*3/uL (ref 0.00–0.07)
Basophils Absolute: 0.1 10*3/uL (ref 0.0–0.1)
Basophils Relative: 1 %
Eosinophils Absolute: 0.8 10*3/uL — ABNORMAL HIGH (ref 0.0–0.5)
Eosinophils Relative: 9 %
HCT: 41 % (ref 39.0–52.0)
Hemoglobin: 14.1 g/dL (ref 13.0–17.0)
Immature Granulocytes: 0 %
Lymphocytes Relative: 30 %
Lymphs Abs: 2.6 10*3/uL (ref 0.7–4.0)
MCH: 31.5 pg (ref 26.0–34.0)
MCHC: 34.4 g/dL (ref 30.0–36.0)
MCV: 91.5 fL (ref 80.0–100.0)
Monocytes Absolute: 0.8 10*3/uL (ref 0.1–1.0)
Monocytes Relative: 10 %
Neutro Abs: 4.4 10*3/uL (ref 1.7–7.7)
Neutrophils Relative %: 50 %
Platelets: 317 10*3/uL (ref 150–400)
RBC: 4.48 MIL/uL (ref 4.22–5.81)
RDW: 16.4 % — ABNORMAL HIGH (ref 11.5–15.5)
WBC: 8.6 10*3/uL (ref 4.0–10.5)
nRBC: 0 % (ref 0.0–0.2)

## 2018-08-14 LAB — I-STAT TROPONIN, ED: Troponin i, poc: 0.01 ng/mL (ref 0.00–0.08)

## 2018-08-14 LAB — TSH: TSH: 0.41 u[IU]/mL (ref 0.350–4.500)

## 2018-08-14 LAB — D-DIMER, QUANTITATIVE: D-Dimer, Quant: <0.27 ug/mL-FEU (ref 0.00–0.50)

## 2018-08-14 LAB — POCT FASTING CBG KUC MANUAL ENTRY: POCT Glucose (KUC): 119 mg/dL — AB (ref 70–99)
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
## 2018-08-14 NOTE — ED Triage Notes (Addendum)
Pt presents to [facility] for assessment of 2 days of feeling dizzy ("like everything is floating around me") and "feeling heavier than I normally do".  Pt denies chest pain, denies SOB.  Pt states he drinks "most days", drinking "3-4 cans" a day.  States last drink was 2 hours ago.  Pt c/o lower back pain and stiffness with getting up.

## 2018-08-14 NOTE — ED Triage Notes (Signed)
Pt came by EMS from Urgent Care after complaining of weakness and dizziness for the past 2 days. No complaints of pain, alert and oriented. Receieved 500 ml bolus. CBG 167.

## 2018-08-14 NOTE — ED Notes (Signed)
Pt transported to xray 

## 2018-08-14 NOTE — ED Provider Notes (Signed)
HPI  SUBJECTIVE:  [name] [name] is a 53 y.o. male who presents with 2 days of intermittent right-sided chest pain described as "squeezing" lasting approximately 5 minutes and then resolving, and dizziness/lightheadedness that lasts several minutes present only when he stands up.  Is not associated with turning his head.  He denies vertigo, shortness of breath, coughing, wheezing, hemoptysis.  His chest pain is not necessarily associated with the lightheadedness.  He denies any exertional component. denies pleuritic pain.  States it is worse with lying down.  Denies belching, water brash.  No nausea, diaphoresis, radiation of this pain up his neck, through to his back or down his arm.  He had symptoms similar to this 2 months ago, but they resolved on its own.  He did not seek medical attention for this.  Denies calf pain, swelling, surgery in the past 4 weeks, recent prolonged immobilization.  States he drinks 2 16 ounces of water per day.  States that he drinks 3 25 ounce beers "every other day" and liquor occasionally.  Last drink was at noon, states that he had a Bud Light.  Denies alcohol withdrawal symptoms.  No change in his physical activity.  Denies vomiting, diarrhea, epistaxis, melena, hematochezia, hematuria.  Drinks 1 cup of caffeine a day.  Denies stimulant use, dietary supplements.  Past medical history negative for PE, DVT, cancer, alcohol abuse or alcohol withdrawal, diabetes, hypertension.  Family history negative for DVT, PE.  PMD: None.    Past Medical History:  Diagnosis Date  . Fracture of great toe, left, closed   . H/O hernia repair   . H/O repair of rotator cuff   . 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 Testosterone deficiency 11/13/2014  . Umbilical hernia     Past Surgical History:  Procedure Laterality Date  . CAPSULOTOMY Left 11/12/2014   Procedure: LEFT KNEE MANIPULATION , CAPSULOTOMY;  Surgeon: Myrene Galas, MD;  Location: [facility] OR;  Service: Orthopedics;  Laterality: Left;   . EXCISIONAL TOTAL HIP ARTHROPLASTY WITH ANTIBIOTIC SPACERS Left 11/12/2014   Procedure: REMOVAL OF ANTIBIOTIC SPACERS;  Surgeon: Myrene Galas, MD;  Location: [facility] OR;  Service: Orthopedics;  Laterality: Left;  . EXTERNAL FIXATION LEG Left 09/30/2014   Procedure: EXTERNAL FIXATION LEG;  Surgeon: Samson Frederic, MD;  Location: [facility] OR;  Service: Orthopedics;  Laterality: Left;  . HERNIA REPAIR    . HIP CLOSED REDUCTION Left 09/30/2014   Procedure: CLOSED REDUCTION HIP;  Surgeon: Samson Frederic, MD;  Location: [facility] OR;  Service: Orthopedics;  Laterality: Left;  . I&D EXTREMITY Left 09/30/2014   Procedure: IRRIGATION AND DEBRIDEMENT EXTREMITY;  Surgeon: Samson Frederic, MD;  Location: [facility] OR;  Service: Orthopedics;  Laterality: Left;  . I&D EXTREMITY Left 10/02/2014   Procedure: IRRIGATION AND DEBRIDEMENT LEFT FEMUR FRACTURE;  Surgeon: Myrene Galas, MD;  Location: [facility] OR;  Service: Orthopedics;  Laterality: Left;  . OPEN REDUCTION INTERNAL FIXATION (ORIF) DISTAL RADIAL FRACTURE Right 11/11/2016   Procedure: Open reduction internal fixation right radial shaft fracture;  Surgeon: Bradly Bienenstock, MD;  Location: [facility] OR;  Service: Orthopedics;  Laterality: Right;  90 mins  . ORIF ACETABULAR FRACTURE Left 10/02/2014   Procedure: OPEN REDUCTION INTERNAL FIXATION (ORIF) LEFT ACETABULAR FRACTURE;  Surgeon: Myrene Galas, MD;  Location: [facility] OR;  Service: Orthopedics;  Laterality: Left;  . ORIF FEMUR FRACTURE Left 11/12/2014   Procedure: LEFT FEMUR NON UNION REPAIR ;  Surgeon: Myrene Galas, MD;  Location: [facility] OR;  Service: Orthopedics;  Laterality: Left;  . ROTATOR CUFF  REPAIR      Family History  Problem Relation Age of Onset  . Diabetes Mother     Social History   Tobacco Use  . Smoking status: Current Every Day Smoker    Packs/day: 0.50    Types: Cigarettes  . Smokeless tobacco: Never Used  Substance Use Topics  . Alcohol use: No  . Drug use: No    Types: Marijuana    No current facility-administered  medications for this encounter.   Current Outpatient Medications:  .  oxyCODONE-acetaminophen (PERCOCET/ROXICET) 5-325 MG tablet, Take 1 tablet by mouth every 6 (six) hours as needed for severe pain., Disp: 10 tablet, Rfl: 0 .  vitamin C (VITAMIN C) 500 MG tablet, Take 1 tablet (500 mg total) by mouth 2 (two) times daily. (Patient not taking: Reported on 11/10/2016), Disp: 60 tablet, Rfl: 1  No Known Allergies   ROS  As noted in HPI.   Physical Exam  BP 111/65 (BP Location: Left Arm)   Pulse (!) 110   Temp 98.3 F (36.8 C) (Oral)   Resp 18   SpO2 97%   Orthostatic VS for the past 24 hrs:  BP- Lying Pulse- Lying BP- Sitting Pulse- Sitting BP- Standing at 0 minutes Pulse- Standing at 0 minutes  08/14/18 1943 110/74 120 111/65 110 111/70 128     Constitutional: Well developed, well nourished, no acute distress Eyes:  EOMI, conjunctiva normal bilaterally HENT: Normocephalic, atraumatic,mucus membranes moist Respiratory: Normal inspiratory effort, lungs clear bilaterally, good air movement. Cardiovascular: Regular tachycardia, no murmurs rubs or gallops . negative carotid bruit bilaterally GI: nondistended skin: No rash, skin intact Musculoskeletal: no deformities.  Calves symmetric, nontender, no edema Neurologic: Alert & oriented x 3, cranial nerves III through XII intact as tested.  Tandem gait steady.  Romberg negative.  No tongue fasciculation or hand tremors Psychiatric: Speech and behavior appropriate   ED Course   Medications - No data to display  Orders Placed This Encounter  Procedures  . POCT CBG (manual entry)    Standing Status:   Standing    Number of Occurrences:   1  . ED EKG    Standing Status:   Standing    Number of Occurrences:   1    Order Specific Question:   Reason for Exam    Answer:   Chest Pain    Results for orders placed or performed during the hospital encounter of 08/14/18 (from the past 24 hour(s))  POCT CBG (manual entry)     Status:  Abnormal   Collection Time: 08/14/18  7:40 PM  Result Value Ref Range   POCT Glucose ([facility]) 119 (A) 70 - 99 mg/dL   No results found.  ED Clinical Impression  Chest pain, unspecified type  Tachycardia  Lightheadedness   ED Assessment/Plan  Fingerstick 119.  EKG: Sinus tachycardia, rate 116.  Normal axis.  Normal intervals.  No hypertrophy.  S wave in 1, Q waves inferiorly, T wave inversion in 3.  No ST elevation.  These changes are new compared to EKG from 09/2014.  Concern for chest pain, dizziness and persistent tachycardia.  He is not orthostatic.  Concern for alcohol withdrawal versus PE given EKG changes.  Sending to the [facility] ED via EMS for cardiac monitoring on route and to ensure that the patient goes to the ED for more comprehensive work-up.  Discussed  MDM, rationale for transfer to the ED with patient.  He agrees with plan.   No orders of  the defined types were placed in this encounter.   *This clinic note was created using Dragon dictation software. Therefore, there may be occasional mistakes despite careful proofreading.   ?   Domenick Gong, MD 08/14/18 2046

## 2018-08-14 NOTE — ED Notes (Signed)
Pt picked up by [facility] and discharged to ER care

## 2018-08-15 LAB — I-STAT TROPONIN, ED: Troponin i, poc: 0.01 ng/mL (ref 0.00–0.08)

## 2018-08-15 NOTE — ED Notes (Signed)
Patient verbalizes understanding of discharge instructions. Opportunity for questioning and answers were provided. Armband removed by staff, pt discharged from ED ambulatory.   

## 2020-03-25 ENCOUNTER — Other Ambulatory Visit: Payer: Self-pay

## 2020-03-25 ENCOUNTER — Ambulatory Visit
Admission: RE | Admit: 2020-03-25 | Discharge: 2020-03-25 | Disposition: A | Discharge status: Home / Self Care | Payer: Self-pay | Source: Ambulatory Visit

## 2020-03-25 VITALS — BP 124/78 | HR 97 | Temp 99.3°F | Resp 20

## 2020-03-25 DIAGNOSIS — R519 Headache, unspecified: Secondary | ICD-10-CM

## 2020-03-25 DIAGNOSIS — Z77098 Contact with and (suspected) exposure to other hazardous, chiefly nonmedicinal, chemicals: Secondary | ICD-10-CM

## 2020-03-25 MED ORDER — KETOROLAC TROMETHAMINE 30 MG/ML IJ SOLN
30.0000 mg | Freq: Once | INTRAMUSCULAR | Status: AC
  Administered 2020-03-25: 30 mg via INTRAMUSCULAR

## 2020-03-25 MED ORDER — DEXAMETHASONE SODIUM PHOSPHATE 10 MG/ML IJ SOLN
10.0000 mg | Freq: Once | INTRAMUSCULAR | Status: AC
  Administered 2020-03-25: 10 mg via INTRAMUSCULAR

## 2020-03-25 NOTE — ED Triage Notes (Addendum)
Patient in with complaints of lower back pain and headache that has occurred off and on for a couple of months. 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 Patient has taken Advil at home for the pain. Last dose was 5 am. Patient states he does feel lightheaded at times after work, but denies currently.
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
## 2020-03-25 NOTE — Discharge Instructions (Addendum)
You were given medications today for your headache. Important to keep a log of your headaches: When they start, what alleviates them, pain on a scale of 1-10. Bring your headache log to your primary care for further evaluation as you may need to be on medications to help prevent headaches. Go to ER for worse headache of life, loss/change of vision, vomiting, fever, ear ringing, dizziness, weakness, facial droop/slurred speech, severe abdominal pain. 

## 2022-01-13 ENCOUNTER — Ambulatory Visit (INDEPENDENT_AMBULATORY_CARE_PROVIDER_SITE_OTHER): Payer: 59 | Admitting: Nurse Practitioner

## 2022-01-13 ENCOUNTER — Encounter: Payer: Self-pay | Admitting: Nurse Practitioner

## 2022-01-13 ENCOUNTER — Ambulatory Visit (INDEPENDENT_AMBULATORY_CARE_PROVIDER_SITE_OTHER): Payer: 59

## 2022-01-13 VITALS — BP 107/71 | HR 89 | Temp 98.4°F | Ht 63.0 in | Wt 122.4 lb

## 2022-01-13 DIAGNOSIS — M545 Low back pain, unspecified: Secondary | ICD-10-CM | POA: Diagnosis not present

## 2022-01-13 DIAGNOSIS — M5489 Other dorsalgia: Secondary | ICD-10-CM

## 2022-01-13 DIAGNOSIS — M5136 Other intervertebral disc degeneration, lumbar region: Secondary | ICD-10-CM | POA: Diagnosis not present

## 2022-01-13 DIAGNOSIS — M4316 Spondylolisthesis, lumbar region: Secondary | ICD-10-CM | POA: Diagnosis not present

## 2022-01-13 MED ORDER — METHOCARBAMOL 500 MG PO TABS: 500.0000 mg | ORAL_TABLET | Freq: Four times a day (QID) | ORAL | 1 refills | Status: DC

## 2022-01-13 MED ORDER — IBUPROFEN 600 MG PO TABS: 600.0000 mg | ORAL_TABLET | Freq: Three times a day (TID) | ORAL | 0 refills | Status: DC | PRN

## 2022-01-13 MED ORDER — PREDNISONE 20 MG PO TABS: 20.0000 mg | ORAL_TABLET | Freq: Every day | ORAL | 0 refills | Status: DC
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
## 2022-01-13 NOTE — Patient Instructions (Signed)
Hernia, Adult     A hernia happens when an organ or tissue inside your body pushes out through a weak spot in the muscles of your belly (abdomen). This makes a bulge. The bulge may be: In a scar from a surgery that was done in your belly (incisional hernia). Near your belly button (umbilical hernia). In your groin (inguinal hernia). Your groin is the area where your leg meets your lower belly. If you are a male, this type could also be in your scrotum. In your upper thigh (femoral hernia). Inside your belly (hiatal hernia). This happens when your stomach slides above the muscle between your belly and your chest (diaphragm). What are the causes? This condition may be caused by: Lifting heavy things. Coughing over a long period of time. Having trouble pooping (constipation). Trouble pooping can lead to straining. A cut from surgery in your belly. A physical problem that is present at birth. Being very overweight. Smoking. Too much fluid in your belly. A testicle that has not moved down into the scrotum, in males. What are the signs or symptoms? The main symptom is a bulge in the area of the hernia, but a bulge may not always be seen. It may grow bigger or be easier to see when you cough or strain (such as when lifting something heavy). A hernia that can be pushed back into the belly rarely causes pain. A hernia that cannot be pushed back into the belly may lose its blood supply. This may cause: Pain. Fever. A feeling like you may vomit, and vomiting. Swelling. Trouble pooping. How is this treated? A hernia that is small and painless may not need to be treated. A hernia that is large or painful may be treated with surgery. Surgery to treat a hernia involves pushing the bulge back into place and repairing the weak area of the muscle or belly. Follow these instructions at home: Activity Avoid straining the muscles near your hernia. This can happen when you: Lift something heavy. Poop  (have a bowel movement). Do not lift anything that is heavier than 10 lb (4.5 kg), or the limit that you are told. When you lift something heavy, use your leg muscles. Do not use your back muscles to lift. Prevent trouble pooping If told by your doctor, take steps to prevent trouble pooping. You may need to: Drink enough fluid to keep your pee (urine) pale yellow. Take medicines. You will be told what medicines to take. Eat foods that are high in fiber. These include beans, whole grains, and fresh fruits and vegetables. Limit foods that are high in fat and sugar. These include fried or sweet foods. General instructions When you cough, try to cough gently. You may try to push your hernia back in by gently pressing on it when you are lying down. Do not try to force the bulge back in if it will not go in easily. If you are overweight, work with your doctor to lose weight safely. Do not smoke or use any products that contain nicotine or tobacco. If you need help quitting, ask your doctor. If you will be having surgery, watch your hernia for changes in shape, size, or color. Tell your doctor if you see any changes. Take over-the-counter and prescription medicines only as told by your doctor. Keep all follow-up visits. Contact a doctor if: You get new pain, swelling, or redness near your hernia. You poop fewer times in a week than normal. You have trouble pooping. You have   poop that is more dry than normal. You have poop that is harder or larger than normal. Get help right away if: You have a fever or chills. You have belly pain that gets worse. You feel like you may vomit, or you vomit. Your hernia cannot be pushed in by gently pressing on it when you are lying down. Your hernia: Changes in shape or size. Changes color. Feels hard, or it hurts when you touch it. These symptoms may be an emergency. Get help right away. Call your local emergency services (911 in the [place] Do not wait to  see if the symptoms will go away. Do not drive yourself to the hospital. Summary A hernia happens when an organ or tissue inside your body pushes out through a weak spot in the belly muscles. This creates a bulge. If your hernia is small and it does not hurt, you may not need treatment. If your hernia is large or it hurts, you may need surgery. If you will be having surgery, watch your hernia for changes in shape, size, or color. Tell your doctor about any changes. This information is not intended to replace advice given to you by your health care provider. Make sure you discuss any questions you have with your health care provider. Document Revised: 12/22/2019 Document Reviewed: 12/22/2019 [employer] Patient Education  2023 [employer] Acute Back Pain, Adult Acute back pain is sudden and usually short-lived. It is often caused by an injury to the muscles and tissues in the back. The injury may result from: A muscle, tendon, or ligament getting overstretched or torn. Ligaments are tissues that connect bones to each other. Lifting something improperly can cause a back strain. Wear and tear (degeneration) of the spinal disks. Spinal disks are circular tissue that provide cushioning between the bones of the spine (vertebrae). Twisting motions, such as while playing sports or doing yard work. A hit to the back. Arthritis. You may have a physical exam, lab tests, and imaging tests to find the cause of your pain. Acute back pain usually goes away with rest and home care. Follow these instructions at home: Managing pain, stiffness, and swelling Take over-the-counter and prescription medicines only as told by your health care provider. Treatment may include medicines for pain and inflammation that are taken by mouth or applied to the skin, or muscle relaxants. Your health care provider may recommend applying ice during the first 24-48 hours after your pain starts. To do this: Put ice in a plastic  bag. Place a towel between your skin and the bag. Leave the ice on for 20 minutes, 2-3 times a day. Remove the ice if your skin turns bright red. This is very important. If you cannot feel pain, heat, or cold, you have a greater risk of damage to the area. If directed, apply heat to the affected area as often as told by your health care provider. Use the heat source that your health care provider recommends, such as a moist heat pack or a heating pad. Place a towel between your skin and the heat source. Leave the heat on for 20-30 minutes. Remove the heat if your skin turns bright red. This is especially important if you are unable to feel pain, heat, or cold. You have a greater risk of getting burned. Activity  Do not stay in bed. Staying in bed for more than 1-2 days can delay your recovery. Sit up and stand up straight. Avoid leaning forward when you sit or hunching  over when you stand. If you work at a desk, sit close to it so you do not need to lean over. Keep your chin tucked in. Keep your neck drawn back, and keep your elbows bent at a 90-degree angle (right angle). Sit high and close to the steering wheel when you drive. Add lower back (lumbar) support to your car seat, if needed. Take short walks on even surfaces as soon as you are able. Try to increase the length of time you walk each day. Do not sit, drive, or stand in one place for more than 30 minutes at a time. Sitting or standing for long periods of time can put stress on your back. Do not drive or use heavy machinery while taking prescription pain medicine. Use proper lifting techniques. When you bend and lift, use positions that put less stress on your back: [place] your knees. Keep the load close to your body. Avoid twisting. Exercise regularly as told by your health care provider. Exercising helps your back heal faster and helps prevent back injuries by keeping muscles strong and flexible. Work with a physical therapist to make  a safe exercise program, as recommended by your health care provider. Do any exercises as told by your physical therapist. Lifestyle Maintain a healthy weight. Extra weight puts stress on your back and makes it difficult to have good posture. Avoid activities or situations that make you feel anxious or stressed. Stress and anxiety increase muscle tension and can make back pain worse. Learn ways to manage anxiety and stress, such as through exercise. General instructions Sleep on a firm mattress in a comfortable position. Try lying on your side with your knees slightly bent. If you lie on your back, put a pillow under your knees. Keep your head and neck in a straight line with your spine (neutral position) when using electronic equipment like smartphones or pads. To do this: Raise your smartphone or pad to look at it instead of bending your head or neck to look down. Put the smartphone or pad at the level of your face while looking at the screen. Follow your treatment plan as told by your health care provider. This may include: Cognitive or behavioral therapy. Acupuncture or massage therapy. Meditation or yoga. Contact a health care provider if: You have pain that is not relieved with rest or medicine. You have increasing pain going down into your legs or buttocks. Your pain does not improve after 2 weeks. You have pain at night. You lose weight without trying. You have a fever or chills. You develop nausea or vomiting. You develop abdominal pain. Get help right away if: You develop new bowel or bladder control problems. You have unusual weakness or numbness in your arms or legs. You feel faint. These symptoms may represent a serious problem that is an emergency. Do not wait to see if the symptoms will go away. Get medical help right away. Call your local emergency services (911 in the [place] Do not drive yourself to the hospital. Summary Acute back pain is sudden and usually  short-lived. Use proper lifting techniques. When you bend and lift, use positions that put less stress on your back. Take over-the-counter and prescription medicines only as told by your health care provider, and apply heat or ice as told. This information is not intended to replace advice given to you by your health care provider. Make sure you discuss any questions you have with your health care provider. Document Revised: 08/06/2020 Document Reviewed:  08/06/2020 [employer] Patient Education  [place].

## 2022-02-16 ENCOUNTER — Ambulatory Visit (HOSPITAL_COMMUNITY): Payer: 59

## 2022-02-24 ENCOUNTER — Ambulatory Visit (INDEPENDENT_AMBULATORY_CARE_PROVIDER_SITE_OTHER): Payer: 59 | Admitting: Nurse Practitioner

## 2022-02-24 ENCOUNTER — Encounter: Payer: Self-pay | Admitting: Nurse Practitioner

## 2022-02-24 VITALS — BP 118/71 | HR 81 | Temp 98.6°F | Ht 63.0 in | Wt 128.8 lb

## 2022-02-24 DIAGNOSIS — Z0000 Encounter for general adult medical examination without abnormal findings: Secondary | ICD-10-CM

## 2022-02-24 DIAGNOSIS — Z1211 Encounter for screening for malignant neoplasm of colon: Secondary | ICD-10-CM | POA: Diagnosis not present

## 2022-02-24 DIAGNOSIS — Z0001 Encounter for general adult medical examination with abnormal findings: Secondary | ICD-10-CM

## 2022-02-24 DIAGNOSIS — M79642 Pain in left hand: Secondary | ICD-10-CM

## 2022-02-24 DIAGNOSIS — M79641 Pain in right hand: Secondary | ICD-10-CM

## 2022-02-24 DIAGNOSIS — M5489 Other dorsalgia: Secondary | ICD-10-CM | POA: Diagnosis not present

## 2022-02-24 LAB — ARTHRITIS PANEL: MCH: 32.9 pg (ref 26.6–33.0)

## 2022-02-24 LAB — LIPID PANEL: LDL Chol Calc (NIH): 108 mg/dL — ABNORMAL HIGH (ref 0–99)

## 2022-02-24 MED ORDER — METHYLPREDNISOLONE ACETATE 80 MG/ML IJ SUSP
80.0000 mg | Freq: Once | INTRAMUSCULAR | Status: AC
  Administered 2022-02-24: 80 mg via INTRAMUSCULAR

## 2022-02-24 MED ORDER — METHOCARBAMOL 750 MG PO TABS: 750.0000 mg | ORAL_TABLET | Freq: Four times a day (QID) | ORAL | 1 refills | Status: DC

## 2022-02-24 NOTE — Patient Instructions (Addendum)
                                                                                                                                                                                                                                                                                                                                                                                                                                                                                                                                                                                                                                                                                                             Health Maintenance, Male Adopting a healthy lifestyle and getting preventive care are important in promoting health and wellness. Ask your health care provider about: The right schedule for you to have regular tests and exams. Things you can do on your own to prevent diseases and keep yourself healthy. What should I know about diet, weight, and exercise? Eat a healthy diet  Eat a diet that includes plenty of vegetables, fruits, low-fat dairy products, and lean protein. Do not eat a lot of foods that are high in solid fats, added sugars, or sodium. Maintain a healthy  weight Body mass index (BMI) is a measurement that can be used to identify possible weight problems. It estimates body fat based on height and weight. Your health care provider can help determine your BMI and help you achieve or maintain a healthy weight. Get regular exercise Get regular exercise. This is one of the most important things you can do for your health. Most adults should: Exercise for at least 150 minutes each week. The exercise should increase your heart rate and make you sweat (moderate-intensity exercise). Do strengthening exercises at least twice a week. This is in addition to the moderate-intensity exercise. Spend less time sitting. Even light physical activity can be beneficial. Watch cholesterol and blood lipids Have your blood tested for lipids and cholesterol at 55 years of age, then have this test every 5 years. You may need to have your cholesterol levels checked more often if: Your lipid or cholesterol levels are high. You are older than 55 years of age. You are at high risk for heart disease. What should I know about cancer screening? Many types of cancers can be detected early and may often be prevented. Depending on your health history and family history, you may need to have cancer screening at various ages. This may include screening for: Colorectal cancer. Prostate cancer. Skin cancer. Lung cancer. What should I know about heart disease, diabetes, and high blood pressure? Blood pressure and heart disease High blood pressure causes heart disease and increases the risk of stroke. This is more likely to develop in people who have high blood pressure readings or are overweight. Talk with your health care provider about your target blood pressure readings. Have your blood pressure checked: Every 3-5 years if you are 83-44 years of age. Every year if you are 22 years old or older. If you are between the ages of 50 and 49 and are a current or former smoker, ask your  health care provider if you should have a one-time screening for abdominal aortic aneurysm (AAA). Diabetes Have regular diabetes screenings. This checks your fasting blood sugar level. Have the screening done: Once every three years after age 23 if you are at a normal weight and have a low risk for diabetes. More often and at a younger age if you are overweight or have a high risk for diabetes. What should I know about preventing infection? Hepatitis B If you have a higher risk for hepatitis B, you should be screened for this virus. Talk with your health care provider to find out if you are at risk for hepatitis B infection. Hepatitis C Blood testing is recommended for: Everyone born from [birth year] through 1964. Anyone with known risk factors for hepatitis C. Sexually transmitted infections (STIs) You should be screened each year for STIs, including gonorrhea and chlamydia, if: You are sexually active and are younger than 55 years of age. You are older than 55 years of age and  your health care provider tells you that you are at risk for this type of infection. Your sexual activity has changed since you were last screened, and you are at increased risk for chlamydia or gonorrhea. Ask your health care provider if you are at risk. Ask your health care provider about whether you are at high risk for HIV. Your health care provider may recommend a prescription medicine to help prevent HIV infection. If you choose to take medicine to prevent HIV, you should first get tested for HIV. You should then be tested every 3 months for as long as you are taking the medicine. Follow these instructions at home: Alcohol use Do not drink alcohol if your health care provider tells you not to drink. If you drink alcohol: Limit how much you have to 0-2 drinks a day. Know how much alcohol is in your drink. In the [place] one drink equals one 12 oz bottle of beer (355 mL), one 5 oz glass of wine (148 mL), or one 1 oz glass  of hard liquor (44 mL). Lifestyle Do not use any products that contain nicotine or tobacco. These products include cigarettes, chewing tobacco, and vaping devices, such as e-cigarettes. If you need help quitting, ask your health care provider. Do not use street drugs. Do not share needles. Ask your health care provider for help if you need support or information about quitting drugs. General instructions Schedule regular health, dental, and eye exams. Stay current with your vaccines. Tell your health care provider if: You often feel depressed. You have ever been abused or do not feel safe at home. Summary Adopting a healthy lifestyle and getting preventive care are important in promoting health and wellness. Follow your health care provider's instructions about healthy diet, exercising, and getting tested or screened for diseases. Follow your health care provider's instructions on monitoring your cholesterol and blood pressure. This information is not intended to replace advice given to you by your health care provider. Make sure you discuss any questions you have with your health care provider. Document Revised: 10/04/2020 Document Reviewed: 10/04/2020 [employer] Patient
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
## 2022-02-25 LAB — ARTHRITIS PANEL
Basophils Absolute: 0.1 10*3/uL (ref 0.0–0.2)
Basos: 1 %
EOS (ABSOLUTE): 0.6 10*3/uL — ABNORMAL HIGH (ref 0.0–0.4)
Eos: 6 %
Hematocrit: 44.5 % (ref 37.5–51.0)
Hemoglobin: 14.9 g/dL (ref 13.0–17.7)
Immature Grans (Abs): 0 10*3/uL (ref 0.0–0.1)
Immature Granulocytes: 0 %
Lymphocytes Absolute: 2.2 10*3/uL (ref 0.7–3.1)
Lymphs: 23 %
MCHC: 33.5 g/dL (ref 31.5–35.7)
MCV: 98 fL — ABNORMAL HIGH (ref 79–97)
Monocytes Absolute: 0.7 10*3/uL (ref 0.1–0.9)
Monocytes: 8 %
Neutrophils Absolute: 5.9 10*3/uL (ref 1.4–7.0)
Neutrophils: 62 %
Platelets: 309 10*3/uL (ref 150–450)
RBC: 4.53 x10E6/uL (ref 4.14–5.80)
RDW: 12.7 % (ref 11.6–15.4)
Rheumatoid fact SerPl-aCnc: <10 IU/mL (ref <14.0)
Sed Rate: 10 mm/hr (ref 0–30)
Uric Acid: 4.5 mg/dL (ref 3.8–8.4)
WBC: 9.5 10*3/uL (ref 3.4–10.8)

## 2022-02-25 LAB — HIV ANTIBODY (ROUTINE TESTING W REFLEX): HIV Screen 4th Generation wRfx: NONREACTIVE

## 2022-02-25 LAB — LIPID PANEL
Chol/HDL Ratio: 2.7 ratio (ref 0.0–5.0)
Cholesterol, Total: 195 mg/dL (ref 100–199)
HDL: 72 mg/dL (ref >39)
Triglycerides: 82 mg/dL (ref 0–149)
VLDL Cholesterol Cal: 15 mg/dL (ref 5–40)

## 2022-02-25 LAB — CMP14+EGFR
ALT: 16 IU/L (ref 0–44)
AST: 113 IU/L — ABNORMAL HIGH (ref 0–40)
Albumin/Globulin Ratio: 1.9 (ref 1.2–2.2)
Albumin: 4.5 g/dL (ref 3.8–4.9)
Alkaline Phosphatase: 71 IU/L (ref 44–121)
BUN/Creatinine Ratio: 14 (ref 9–20)
BUN: 10 mg/dL (ref 6–24)
Bilirubin Total: 0.8 mg/dL (ref 0.0–1.2)
CO2: 20 mmol/L (ref 20–29)
Calcium: 9.6 mg/dL (ref 8.7–10.2)
Chloride: 103 mmol/L (ref 96–106)
Creatinine, Ser: 0.73 mg/dL — ABNORMAL LOW (ref 0.76–1.27)
Globulin, Total: 2.4 g/dL (ref 1.5–4.5)
Glucose: 66 mg/dL — ABNORMAL LOW (ref 70–99)
Potassium: 4.7 mmol/L (ref 3.5–5.2)
Sodium: 140 mmol/L (ref 134–144)
Total Protein: 6.9 g/dL (ref 6.0–8.5)
eGFR: 107 mL/min/{1.73_m2} (ref >59)

## 2022-02-25 LAB — HEPATITIS C ANTIBODY: Hep C Virus Ab: NONREACTIVE

## 2022-02-27 ENCOUNTER — Other Ambulatory Visit: Payer: Self-pay | Admitting: Nurse Practitioner

## 2022-02-27 DIAGNOSIS — R748 Abnormal levels of other serum enzymes: Secondary | ICD-10-CM

## 2022-03-05 LAB — COLOGUARD

## 2022-03-24 ENCOUNTER — Encounter (HOSPITAL_COMMUNITY): Payer: Self-pay

## 2022-03-24 ENCOUNTER — Ambulatory Visit (HOSPITAL_COMMUNITY): Payer: 59

## 2022-06-02 ENCOUNTER — Ambulatory Visit: Payer: 59 | Admitting: Nurse Practitioner

## 2022-06-06 ENCOUNTER — Encounter: Payer: Self-pay | Admitting: Nurse Practitioner

## 2023-07-16 ENCOUNTER — Emergency Department (HOSPITAL_COMMUNITY): Payer: Self-pay

## 2023-07-16 ENCOUNTER — Inpatient Hospital Stay (HOSPITAL_COMMUNITY)
Admission: EM | Admit: 2023-07-16 | Discharge: 2023-07-22 | DRG: 640 | Disposition: A | Discharge status: Home / Self Care | Payer: No Typology Code available for payment source | Attending: Family Medicine | Admitting: Family Medicine

## 2023-07-16 ENCOUNTER — Other Ambulatory Visit: Payer: Self-pay

## 2023-07-16 ENCOUNTER — Encounter (HOSPITAL_COMMUNITY): Payer: Self-pay | Admitting: Emergency Medicine

## 2023-07-16 ENCOUNTER — Inpatient Hospital Stay (HOSPITAL_COMMUNITY): Payer: Self-pay

## 2023-07-16 DIAGNOSIS — E871 Hypo-osmolality and hyponatremia: Secondary | ICD-10-CM | POA: Diagnosis not present

## 2023-07-16 DIAGNOSIS — Y92009 Unspecified place in unspecified non-institutional (private) residence as the place of occurrence of the external cause: Secondary | ICD-10-CM

## 2023-07-16 DIAGNOSIS — M6282 Rhabdomyolysis: Secondary | ICD-10-CM | POA: Diagnosis not present

## 2023-07-16 DIAGNOSIS — F1410 Cocaine abuse, uncomplicated: Secondary | ICD-10-CM | POA: Diagnosis present

## 2023-07-16 DIAGNOSIS — R4020 Unspecified coma: Principal | ICD-10-CM

## 2023-07-16 DIAGNOSIS — S0083XA Contusion of other part of head, initial encounter: Secondary | ICD-10-CM | POA: Diagnosis present

## 2023-07-16 DIAGNOSIS — E860 Dehydration: Secondary | ICD-10-CM | POA: Diagnosis present

## 2023-07-16 DIAGNOSIS — R918 Other nonspecific abnormal finding of lung field: Secondary | ICD-10-CM | POA: Diagnosis present

## 2023-07-16 DIAGNOSIS — Z1152 Encounter for screening for COVID-19: Secondary | ICD-10-CM

## 2023-07-16 DIAGNOSIS — K7010 Alcoholic hepatitis without ascites: Secondary | ICD-10-CM | POA: Diagnosis not present

## 2023-07-16 DIAGNOSIS — W19XXXA Unspecified fall, initial encounter: Secondary | ICD-10-CM | POA: Diagnosis present

## 2023-07-16 DIAGNOSIS — Z716 Tobacco abuse counseling: Secondary | ICD-10-CM | POA: Diagnosis not present

## 2023-07-16 DIAGNOSIS — F1010 Alcohol abuse, uncomplicated: Secondary | ICD-10-CM | POA: Diagnosis present

## 2023-07-16 DIAGNOSIS — Z79899 Other long term (current) drug therapy: Secondary | ICD-10-CM

## 2023-07-16 DIAGNOSIS — F17200 Nicotine dependence, unspecified, uncomplicated: Secondary | ICD-10-CM | POA: Diagnosis not present

## 2023-07-16 DIAGNOSIS — Z833 Family history of diabetes mellitus: Secondary | ICD-10-CM

## 2023-07-16 DIAGNOSIS — E861 Hypovolemia: Secondary | ICD-10-CM | POA: Diagnosis present

## 2023-07-16 DIAGNOSIS — Y900 Blood alcohol level of less than 20 mg/100 ml: Secondary | ICD-10-CM | POA: Diagnosis present

## 2023-07-16 DIAGNOSIS — J101 Influenza due to other identified influenza virus with other respiratory manifestations: Secondary | ICD-10-CM | POA: Diagnosis present

## 2023-07-16 DIAGNOSIS — Z96642 Presence of left artificial hip joint: Secondary | ICD-10-CM | POA: Diagnosis present

## 2023-07-16 DIAGNOSIS — G9341 Metabolic encephalopathy: Secondary | ICD-10-CM | POA: Diagnosis present

## 2023-07-16 DIAGNOSIS — F17210 Nicotine dependence, cigarettes, uncomplicated: Secondary | ICD-10-CM | POA: Diagnosis present

## 2023-07-16 LAB — LACTIC ACID, PLASMA: Lactic Acid, Venous: 1.2 mmol/L (ref 0.5–1.9)

## 2023-07-16 LAB — RAPID URINE DRUG SCREEN, HOSP PERFORMED
Amphetamines: NOT DETECTED
Barbiturates: NOT DETECTED
Benzodiazepines: NOT DETECTED
Cocaine: POSITIVE — AB
Opiates: NOT DETECTED
Tetrahydrocannabinol: NOT DETECTED

## 2023-07-16 LAB — RESP PANEL BY RT-PCR (RSV, FLU A&B, COVID)  RVPGX2
Influenza A by PCR: POSITIVE — AB
Influenza B by PCR: NEGATIVE
Resp Syncytial Virus by PCR: NEGATIVE
SARS Coronavirus 2 by RT PCR: NEGATIVE

## 2023-07-16 LAB — SODIUM
Sodium: 119 mmol/L — CL (ref 135–145)
Sodium: 122 mmol/L — ABNORMAL LOW (ref 135–145)
Sodium: 129 mmol/L — ABNORMAL LOW (ref 135–145)
Sodium: 135 mmol/L (ref 135–145)
Sodium: 134 mmol/L — ABNORMAL LOW (ref 135–145)

## 2023-07-16 LAB — CBC WITH DIFFERENTIAL/PLATELET
Abs Immature Granulocytes: 0 10*3/uL (ref 0.00–0.07)
Basophils Absolute: 0.1 10*3/uL (ref 0.0–0.1)
Basophils Relative: 1 %
Eosinophils Absolute: 0 10*3/uL (ref 0.0–0.5)
Eosinophils Relative: 0 %
HCT: 34.6 % — ABNORMAL LOW (ref 39.0–52.0)
Hemoglobin: 12.5 g/dL — ABNORMAL LOW (ref 13.0–17.0)
Lymphocytes Relative: 6 %
Lymphs Abs: 0.4 10*3/uL — ABNORMAL LOW (ref 0.7–4.0)
MCH: 33.4 pg (ref 26.0–34.0)
MCHC: 36.1 g/dL — ABNORMAL HIGH (ref 30.0–36.0)
MCV: 92.5 fL (ref 80.0–100.0)
Monocytes Absolute: 1.2 10*3/uL — ABNORMAL HIGH (ref 0.1–1.0)
Monocytes Relative: 17 %
Neutro Abs: 5.3 10*3/uL (ref 1.7–7.7)
Neutrophils Relative %: 76 %
Platelets: 199 10*3/uL (ref 150–400)
RBC: 3.74 MIL/uL — ABNORMAL LOW (ref 4.22–5.81)
RDW: 12.7 % (ref 11.5–15.5)
WBC: 7 10*3/uL (ref 4.0–10.5)
nRBC: 0 % (ref 0.0–0.2)

## 2023-07-16 LAB — URINALYSIS, ROUTINE W REFLEX MICROSCOPIC
Bacteria, UA: NONE SEEN
Bilirubin Urine: NEGATIVE
Glucose, UA: NEGATIVE mg/dL
Ketones, ur: 5 mg/dL — AB
Leukocytes,Ua: NEGATIVE
Nitrite: NEGATIVE
Protein, ur: NEGATIVE mg/dL
Specific Gravity, Urine: 1.008 (ref 1.005–1.030)
pH: 6 (ref 5.0–8.0)

## 2023-07-16 LAB — HEPATIC FUNCTION PANEL
ALT: 20 U/L (ref 0–44)
AST: 85 U/L — ABNORMAL HIGH (ref 15–41)
Albumin: 3.4 g/dL — ABNORMAL LOW (ref 3.5–5.0)
Alkaline Phosphatase: 46 U/L (ref 38–126)
Bilirubin, Direct: 0.1 mg/dL (ref 0.0–0.2)
Indirect Bilirubin: 0.5 mg/dL (ref 0.3–0.9)
Total Bilirubin: 0.6 mg/dL (ref 0.0–1.2)
Total Protein: 5.9 g/dL — ABNORMAL LOW (ref 6.5–8.1)

## 2023-07-16 LAB — CK: Total CK: 1286 U/L — ABNORMAL HIGH (ref 49–397)

## 2023-07-16 LAB — SODIUM, URINE, RANDOM
Sodium, Ur: 42 mmol/L
Sodium, Ur: 13 mmol/L

## 2023-07-16 LAB — TSH: TSH: 0.308 u[IU]/mL — ABNORMAL LOW (ref 0.350–4.500)

## 2023-07-16 LAB — BASIC METABOLIC PANEL
Anion gap: 6 (ref 5–15)
BUN: 5 mg/dL — ABNORMAL LOW (ref 6–20)
CO2: 21 mmol/L — ABNORMAL LOW (ref 22–32)
Calcium: 7.7 mg/dL — ABNORMAL LOW (ref 8.9–10.3)
Chloride: 90 mmol/L — ABNORMAL LOW (ref 98–111)
Creatinine, Ser: 0.66 mg/dL (ref 0.61–1.24)
GFR, Estimated: >60 mL/min (ref >60)
Glucose, Bld: 106 mg/dL — ABNORMAL HIGH (ref 70–99)
Potassium: 3.9 mmol/L (ref 3.5–5.1)
Sodium: 117 mmol/L — CL (ref 135–145)

## 2023-07-16 LAB — ETHANOL: Alcohol, Ethyl (B): <10 mg/dL (ref <10)

## 2023-07-16 LAB — MRSA NEXT GEN BY PCR, NASAL: MRSA by PCR Next Gen: NOT DETECTED

## 2023-07-16 LAB — OSMOLALITY, URINE: Osmolality, Ur: 288 mosm/kg — ABNORMAL LOW (ref 300–900)

## 2023-07-16 LAB — OSMOLALITY
Osmolality: 250 mosm/kg — ABNORMAL LOW (ref 275–295)
Osmolality: 260 mosm/kg — ABNORMAL LOW (ref 275–295)

## 2023-07-16 LAB — CORTISOL: Cortisol, Plasma: 26.4 ug/dL

## 2023-07-16 LAB — HIV ANTIBODY (ROUTINE TESTING W REFLEX): HIV Screen 4th Generation wRfx: NONREACTIVE

## 2023-07-16 MED ORDER — DESMOPRESSIN ACETATE 4 MCG/ML IJ SOLN
1.0000 ug | Freq: Four times a day (QID) | INTRAMUSCULAR | Status: AC
  Administered 2023-07-16 – 2023-07-17 (×2): 1 ug via INTRAVENOUS
  Filled 2023-07-16 (×2): qty 0.25

## 2023-07-16 MED ORDER — SODIUM CHLORIDE 0.45 % IV SOLN: INTRAVENOUS | Status: DC

## 2023-07-16 MED ORDER — ADULT MULTIVITAMIN W/MINERALS CH
1.0000 | ORAL_TABLET | Freq: Every day | ORAL | Status: DC
  Administered 2023-07-16 – 2023-07-22 (×7): 1 via ORAL
  Filled 2023-07-16 (×7): qty 1

## 2023-07-16 MED ORDER — LORAZEPAM 2 MG/ML IJ SOLN: 1.0000 mg | INTRAMUSCULAR | Status: DC | PRN

## 2023-07-16 MED ORDER — SODIUM CHLORIDE 0.9 % IV SOLN: INTRAVENOUS | Status: DC

## 2023-07-16 MED ORDER — HYDRALAZINE HCL 20 MG/ML IJ SOLN: 10.0000 mg | INTRAMUSCULAR | Status: DC | PRN

## 2023-07-16 MED ORDER — OSELTAMIVIR PHOSPHATE 75 MG PO CAPS
75.0000 mg | ORAL_CAPSULE | Freq: Two times a day (BID) | ORAL | Status: AC
  Administered 2023-07-16 – 2023-07-20 (×10): 75 mg via ORAL
  Filled 2023-07-16 (×10): qty 1

## 2023-07-16 MED ORDER — ONDANSETRON HCL 4 MG PO TABS: 4.0000 mg | ORAL_TABLET | Freq: Four times a day (QID) | ORAL | Status: DC | PRN

## 2023-07-16 MED ORDER — ONDANSETRON HCL 4 MG/2ML IJ SOLN: 4.0000 mg | Freq: Four times a day (QID) | INTRAMUSCULAR | Status: DC | PRN

## 2023-07-16 MED ORDER — ACETAMINOPHEN 500 MG PO TABS
1000.0000 mg | ORAL_TABLET | Freq: Once | ORAL | Status: AC
  Administered 2023-07-16: 1000 mg via ORAL

## 2023-07-16 MED ORDER — FOLIC ACID 1 MG PO TABS
1.0000 mg | ORAL_TABLET | Freq: Every day | ORAL | Status: DC
  Administered 2023-07-16 – 2023-07-22 (×7): 1 mg via ORAL
  Filled 2023-07-16 (×7): qty 1

## 2023-07-16 MED ORDER — THIAMINE HCL 100 MG/ML IJ SOLN: 100.0000 mg | Freq: Every day | INTRAMUSCULAR | Status: DC

## 2023-07-16 MED ORDER — SODIUM CHLORIDE 0.9 % IV BOLUS
1000.0000 mL | Freq: Once | INTRAVENOUS | Status: AC
  Administered 2023-07-16: 1000 mL via INTRAVENOUS

## 2023-07-16 MED ORDER — THIAMINE MONONITRATE 100 MG PO TABS
100.0000 mg | ORAL_TABLET | Freq: Every day | ORAL | Status: DC
  Administered 2023-07-16 – 2023-07-22 (×7): 100 mg via ORAL
  Filled 2023-07-16 (×7): qty 1

## 2023-07-16 MED ORDER — CHLORHEXIDINE GLUCONATE CLOTH 2 % EX PADS
6.0000 | MEDICATED_PAD | Freq: Every day | CUTANEOUS | Status: DC
  Administered 2023-07-16 – 2023-07-19 (×4): 6 via TOPICAL

## 2023-07-16 MED ORDER — DEXTROSE 5 % IV SOLN: INTRAVENOUS | Status: DC

## 2023-07-16 MED ORDER — SODIUM CHLORIDE 0.9 % IV SOLN: Freq: Once | INTRAVENOUS | Status: AC

## 2023-07-16 MED ORDER — SODIUM CHLORIDE 0.9% FLUSH
3.0000 mL | Freq: Two times a day (BID) | INTRAVENOUS | Status: DC
  Administered 2023-07-16 – 2023-07-22 (×8): 3 mL via INTRAVENOUS

## 2023-07-16 MED ORDER — LORAZEPAM 1 MG PO TABS: 1.0000 mg | ORAL_TABLET | ORAL | Status: DC | PRN

## 2023-07-16 MED ORDER — ENOXAPARIN SODIUM 40 MG/0.4ML IJ SOSY
40.0000 mg | PREFILLED_SYRINGE | INTRAMUSCULAR | Status: DC
  Administered 2023-07-16 – 2023-07-22 (×6): 40 mg via SUBCUTANEOUS
  Filled 2023-07-16 (×6): qty 0.4

## 2023-07-16 MED ORDER — ACETAMINOPHEN 325 MG PO TABS
650.0000 mg | ORAL_TABLET | Freq: Four times a day (QID) | ORAL | Status: DC | PRN
  Administered 2023-07-16 – 2023-07-18 (×6): 650 mg via ORAL
  Filled 2023-07-16 (×6): qty 2

## 2023-07-16 MED ORDER — NICOTINE 21 MG/24HR TD PT24: 21.0000 mg | MEDICATED_PATCH | Freq: Every day | TRANSDERMAL | Status: DC | PRN

## 2023-07-16 MED ORDER — GUAIFENESIN-DM 100-10 MG/5ML PO SYRP
5.0000 mL | ORAL_SOLUTION | ORAL | Status: DC | PRN
  Administered 2023-07-16 – 2023-07-18 (×5): 5 mL via ORAL
  Filled 2023-07-16 (×5): qty 5

## 2023-07-16 MED ORDER — ACETAMINOPHEN 650 MG RE SUPP: 650.0000 mg | Freq: Four times a day (QID) | RECTAL | Status: DC | PRN

## 2023-07-16 NOTE — ED Notes (Signed)
 Patient transported to CT

## 2023-07-16 NOTE — H&P (Signed)
 History and Physical    Patient: [name] [name] ZOX:[id number] DOB: 01/10/1966 DOA: 07/16/2023 DOS: the patient was seen and examined on 07/16/2023 PCP: Patient, No Pcp Per  Patient coming from: Home  Chief Complaint:  Chief Complaint  Patient presents with   Loss of Consciousness   HPI: [name] [name] is a 58 y.o. male with a history of EtOH abuse, tobacco use who presented to the ED when is partner called EMS after he was found down at home. He reports he drank a 40oz of beer, which is his usual but is unable to give much more history. He had a headache when he got here. He and his partner have had cough but no fevers over the past couple days. He was found to have +influenza A, UDS +cocaine, serum ethanol level negative. Severe (and new) hyponatremia was noted with Na 117. Trauma scans negative. PCCM consulted for admission to ICU, advised admission to [facility] ICU. History otherwise limited to EDP discussion, documentation, and patient's incomplete recollection.   Review of Systems: unable to review all systems due to the inability of the patient to answer questions. Past Medical History:  Diagnosis Date   Fracture of great toe, left, closed    H/O hernia repair    H/O repair of rotator cuff    Knee contracture 11/13/2014   Testosterone deficiency 11/13/2014   Umbilical hernia    Past Surgical History:  Procedure Laterality Date   CAPSULOTOMY Left 11/12/2014   Procedure: LEFT KNEE MANIPULATION , CAPSULOTOMY;  Surgeon: Myrene Galas, MD;  Location: [facility] OR;  Service: Orthopedics;  Laterality: Left;   EXCISIONAL TOTAL HIP ARTHROPLASTY WITH ANTIBIOTIC SPACERS Left 11/12/2014   Procedure: REMOVAL OF ANTIBIOTIC SPACERS;  Surgeon: Myrene Galas, MD;  Location: [facility] OR;  Service: Orthopedics;  Laterality: Left;   EXTERNAL FIXATION LEG Left 09/30/2014   Procedure: EXTERNAL FIXATION LEG;  Surgeon: Samson Frederic, MD;  Location: [facility] OR;  Service: Orthopedics;  Laterality: Left;   HERNIA REPAIR     HIP  CLOSED REDUCTION Left 09/30/2014   Procedure: CLOSED REDUCTION HIP;  Surgeon: Samson Frederic, MD;  Location: [facility] OR;  Service: Orthopedics;  Laterality: Left;   I & D EXTREMITY Left 09/30/2014   Procedure: IRRIGATION AND DEBRIDEMENT EXTREMITY;  Surgeon: Samson Frederic, MD;  Location: [facility] OR;  Service: Orthopedics;  Laterality: Left;   I & D EXTREMITY Left 10/02/2014   Procedure: IRRIGATION AND DEBRIDEMENT LEFT FEMUR FRACTURE;  Surgeon: Myrene Galas, MD;  Location: [facility] OR;  Service: Orthopedics;  Laterality: Left;   OPEN REDUCTION INTERNAL FIXATION (ORIF) DISTAL RADIAL FRACTURE Right 11/11/2016   Procedure: Open reduction internal fixation right radial shaft fracture;  Surgeon: Bradly Bienenstock, MD;  Location: [facility] OR;  Service: Orthopedics;  Laterality: Right;  90 mins   ORIF ACETABULAR FRACTURE Left 10/02/2014   Procedure: OPEN REDUCTION INTERNAL FIXATION (ORIF) LEFT ACETABULAR FRACTURE;  Surgeon: Myrene Galas, MD;  Location: [facility] OR;  Service: Orthopedics;  Laterality: Left;   ORIF FEMUR FRACTURE Left 11/12/2014   Procedure: LEFT FEMUR NON UNION REPAIR ;  Surgeon: Myrene Galas, MD;  Location: [facility] OR;  Service: Orthopedics;  Laterality: Left;   ROTATOR CUFF REPAIR     Social History:  reports that he has been smoking cigarettes. He has never used smokeless tobacco. He reports current alcohol use of about 10.0 standard drinks of alcohol per week. He reports current drug use. Drug: Marijuana.  No Known Allergies  Family History  Problem Relation Age of Onset  Diabetes Mother     Prior to Admission medications   Medication Sig Start Date End Date Taking? Authorizing Provider  ibuprofen (ADVIL) 600 MG tablet Take 1 tablet (600 mg total) by mouth every 8 (eight) hours as needed. Patient not taking: Reported on 02/24/2022 01/13/22   Daryll Drown, NP  methocarbamol (ROBAXIN) 750 MG tablet Take 1 tablet (750 mg total) by mouth 4 (four) times daily. 02/24/22   Daryll Drown, NP  Multiple Vitamins-Minerals  (MULTIVITAMIN WITH IRON-MINERALS) liquid Take by mouth daily. Patient not taking: Reported on 02/24/2022    [provider]  predniSONE (DELTASONE) 20 MG tablet Take 1 tablet (20 mg total) by mouth daily with breakfast. Patient not taking: Reported on 02/24/2022 01/13/22   Daryll Drown, NP    Physical Exam: Vitals:   07/16/23 0745 07/16/23 0800 07/16/23 0909 07/16/23 0915  BP: 99/88 108/68  110/60  Pulse: 82 86  85  Resp: 19 (!) 25  (!) 27  Temp:      TempSrc:      SpO2: 91% (!) 89%  94%  Weight:   58.9 kg   Height:   5\' 2"  (1.575 m)   Gen: Ill-appearing male in no acute distress Pulm: Clear, nonlabored  CV: RRR, no MRG or pitting edema GI: Soft, NT, ND, +BS  Neuro: Alert and interactive with impaired short term recall. No new focal deficits. Ext: Warm, no deformities. Skin: Hematoma left forehead. No other wounds on visualized skin   Data Reviewed: Na 117 >> 119 >> 122 SCr 0.66, bicarb 21, LA 1.2 CK 1,386 WBC 7k (lymphopenia with ALC 400), hgb 12.5g/dl plt 161W CT head/cervical spine: No acute injury R ankle XR: Negative Portable CXR: vascular congestion, nodular left parahilar density  CT chest: No correlate nodule/opacity. (See full report)  Assessment and Plan: Hyponatremia: Hypovolemic component seems to be primary, no typical causes of SIADH noted.  - Continue isotonic saline and serial monitoring in ICU. Avoid overly aggressive rate of correction. His confusion may be due directly to this, though also likely an element of cocaine intoxication/withdrawal, EtOH withdrawal, dehydration itself, and infection.  - TSH mildly abnormal as discussed below.  - Cortisol drawn at 7:20am is pending.  - Regular diet for now.  - Recheck UNa, check serum osm.   Influenza A: I found a case report of influenza-induced SIADH from 2015 though this does not appear to be a common correlation.  - Unclear timeline of symptoms, will start tamiflu.  - Supportive care  Fall at  home, rhabdomyolysis: Trauma scan neg.  - PT eval once more clinically stable - Monitor CK with IVF (elevation due to fall/cocaine)  Alcohol abuse: By report. No evidence of intoxication and his drinking appears to follow binge pattern, confusion limits history.  - CIWA  Incidental pulmonary nodules: Abnormality on CXR in setting of hyponatremia (r/o SIADH due to lung malignancy), CT ordered and shows no predominant nodule correlate. - Repeat CT in 3-6 months  Acute metabolic encephalopathy:  - Monitor in ICU.   Abnormal TSH: TSH level 0.308 (LLN 0.35) - Check free T4 and free T3, though likely plan is to repeat TFTs after discharge, not likely to be contributing to hyponatremia.   Cocaine use:  - Will provide cessation counseling   Advance Care Planning: Full code  Consults: PCCM notified, will touch base prn  Family Communication: None at bedside  Severity of Illness: The appropriate patient status for this patient is INPATIENT. Inpatient status is  judged to be reasonable and necessary in order to provide the required intensity of service to ensure the patient's safety. The patient's presenting symptoms, physical exam findings, and initial radiographic and laboratory data in the context of their chronic comorbidities is felt to place them at high risk for further clinical deterioration. Furthermore, it is not anticipated that the patient will be medically stable for discharge from the hospital within 2 midnights of admission.   * I certify that at the point of admission it is my clinical judgment that the patient will require inpatient hospital care spanning beyond 2 midnights from the point of admission due to high intensity of service, high risk for further deterioration and high frequency of surveillance required.*  Author: Tyrone Nine, MD 07/16/2023 10:43 AM  For on call review [email or web address].

## 2023-07-16 NOTE — Progress Notes (Signed)
 07/16/2023 Discussed with EDP. Some alcohol hx here after a fall at home in context of he and partner feeling unwell for a couple days. Partner does not think patient has been drinking much due to this illness. Trauma scan neg. Labs notable for sodium 118.  Last labs 2023 normal sodium Euvolemic per EDP. Mentation improved but still cannot remember what happened. HD stable. None of home meds jump out for SIADH.  Think we can just try some normal saline, get following labs TSH, cortisol, urine/serum osm, RVP, lactate, CK  Given shortage of beds at [facility] and no significant hemodynamic issues think we can keep at [facility] for workup of de novo hyponatremia.  Ideally get sodium to 125 over next 24h then target normo-natremia; if this an acute hyponatremia he may self-correct regardless.  Will touch base with [facility] tomorrow to assure heading in right direction.  Please reach out if any changes.  Myrla Halsted MD PCCM

## 2023-07-16 NOTE — ED Triage Notes (Addendum)
 Pt presents to the ED via [facility] with complaints of LOC tonight after alcohol consumption. Pt fell and broke a wooden table. Pt has skin tears on his arms, hematoma to the L side of his head. Of note, the patient believes he and his S/O have the flu due to intermittent chills, fevers, and cough. A&Ox4 at this time. Denies CP or SOB.    Pt in c-collar by EMS.

## 2023-07-16 NOTE — ED Provider Notes (Addendum)
 [place] EMERGENCY DEPARTMENT AT [facility] Provider Note   CSN: [id number] Arrival date & time: 07/16/23  0403     History  Chief Complaint  Patient presents with   Loss of Consciousness    [name] [name] is a 58 y.o. male.  HPI     This is a 58 year old male who presents by EMS with concerns for loss of consciousness.  + etoh.  EMS reported that he fell and broke a table.  Noted to have a hematoma to the left forehead.  Patient cannot provide much history.  He cannot tell me how much alcohol he had to drink tonight.  Reporting only pain in the head.  Level 5 caveat.  Home Medications Prior to Admission medications   Medication Sig Start Date End Date Taking? Authorizing Provider  ibuprofen (ADVIL) 600 MG tablet Take 1 tablet (600 mg total) by mouth every 8 (eight) hours as needed. Patient not taking: Reported on 02/24/2022 01/13/22   Daryll Drown, NP  methocarbamol (ROBAXIN) 750 MG tablet Take 1 tablet (750 mg total) by mouth 4 (four) times daily. 02/24/22   Daryll Drown, NP  Multiple Vitamins-Minerals (MULTIVITAMIN WITH IRON-MINERALS) liquid Take by mouth daily. Patient not taking: Reported on 02/24/2022    [provider]  predniSONE (DELTASONE) 20 MG tablet Take 1 tablet (20 mg total) by mouth daily with breakfast. Patient not taking: Reported on 02/24/2022 01/13/22   Daryll Drown, NP      Allergies    Patient has no known allergies.    Review of Systems   Review of Systems  Constitutional:  Negative for fever.  Respiratory:  Negative for shortness of breath.   Cardiovascular:  Negative for chest pain.  Neurological:  Positive for headaches.  All other systems reviewed and are negative.   Physical Exam Updated Vital Signs BP 118/70   Pulse 87   Temp 99 F (37.2 C) (Oral)   Resp 18   Ht 1.6 m (5\' 3" )   Wt 59 kg   SpO2 97%   BMI 23.03 kg/m  Physical Exam Vitals and nursing note reviewed.  Constitutional:       Appearance: He is well-developed. He is not ill-appearing.  HENT:     Head: Normocephalic.     Comments: Hematoma left forehead    Nose: Nose normal.     Mouth/Throat:     Mouth: Mucous membranes are moist.  Eyes:     Pupils: Pupils are equal, round, and reactive to light.     Comments: 8 mm minimally reactive  Neck:     Comments: C-collar in place Cardiovascular:     Rate and Rhythm: Normal rate and regular rhythm.     Heart sounds: Normal heart sounds. No murmur heard. Pulmonary:     Effort: Pulmonary effort is normal. No respiratory distress.     Breath sounds: Normal breath sounds. No wheezing.  Abdominal:     Palpations: Abdomen is soft.     Tenderness: There is no abdominal tenderness. There is no rebound.  Lymphadenopathy:     Cervical: No cervical adenopathy.  Skin:    General: Skin is warm and dry.  Neurological:     Mental Status: He is alert and oriented to person, place, and time.     Comments: Appears intoxicated  Psychiatric:        Mood and Affect: Mood normal.     ED Results / Procedures / Treatments   Labs (all  labs ordered are listed, but only abnormal results are displayed) Labs Reviewed  CBC WITH DIFFERENTIAL/PLATELET - Abnormal; Notable for the following components:      Result Value   RBC 3.74 (*)    Hemoglobin 12.5 (*)    HCT 34.6 (*)    MCHC 36.1 (*)    All other components within normal limits  BASIC METABOLIC PANEL - Abnormal; Notable for the following components:   Sodium 117 (*)    Chloride 90 (*)    CO2 21 (*)    Glucose, Bld 106 (*)    BUN 5 (*)    Calcium 7.7 (*)    All other components within normal limits  RESP PANEL BY RT-PCR (RSV, FLU A&B, COVID)  RVPGX2  ETHANOL  URINALYSIS, ROUTINE W REFLEX MICROSCOPIC  OSMOLALITY  SODIUM, URINE, RANDOM  OSMOLALITY, URINE  HEPATIC FUNCTION PANEL  CK  LACTIC ACID, PLASMA  SODIUM  SODIUM  SODIUM  SODIUM  SODIUM  TSH  CORTISOL    EKG EKG Interpretation Date/Time:  Monday  July 16 2023 04:16:53 EST Ventricular Rate:  99 PR Interval:  161 QRS Duration:  84 QT Interval:  337 QTC Calculation: 433 R Axis:   76  Text Interpretation: Sinus tachycardia Ventricular premature complex Probable left atrial enlargement Minimal ST elevation, anterior leads Confirmed by Ross Marcus ([id number]) on 07/16/2023 6:15:57 AM  Radiology DG Ankle Complete Right Result Date: 07/16/2023 CLINICAL DATA:  Fall related to alcohol consumption. EXAM: RIGHT ANKLE - COMPLETE 3 VIEW COMPARISON:  None Available. FINDINGS: There is no evidence of fracture, dislocation, or joint effusion. There is no evidence of arthropathy or other focal bone abnormality. Soft tissues are unremarkable. IMPRESSION: Negative. Electronically Signed   By: Tiburcio Pea M.D.   On: 07/16/2023 07:02   CT Head Wo Contrast Result Date: 07/16/2023 CLINICAL DATA:  Neck trauma, intoxication with fall. EXAM: CT HEAD WITHOUT CONTRAST CT CERVICAL SPINE WITHOUT CONTRAST TECHNIQUE: Multidetector CT imaging of the head and cervical spine was performed following the standard protocol without intravenous contrast. Multiplanar CT image reconstructions of the cervical spine were also generated. RADIATION DOSE REDUCTION: This exam was performed according to the departmental dose-optimization program which includes automated exposure control, adjustment of the mA and/or kV according to patient size and/or use of iterative reconstruction technique. COMPARISON:  09/30/2014 FINDINGS: CT HEAD FINDINGS Brain: No evidence of acute infarction, hemorrhage, hydrocephalus, extra-axial collection or mass lesion/mass effect. Vascular: No hyperdense vessel or unexpected calcification. Skull: Remote left frontal fracture depressing the anterior wall of the left frontal sinus. Left-sided scalp swelling. Sinuses/Orbits: Patchy paranasal sinus opacification. There is a left maxillary fluid level that is low-density and likely inflammatory. Chronic  posttraumatic deformity to the left frontal sinus. CT CERVICAL SPINE FINDINGS Alignment: No traumatic malalignment Skull base and vertebrae: No acute fracture. No primary bone lesion or focal pathologic process. Soft tissues and spinal canal: No prevertebral fluid or swelling. No visible canal hematoma. Disc levels:  Generalized degenerative endplate and facet spurring. Upper chest: Clear apical lungs. IMPRESSION: No evidence of acute intracranial or cervical spine injury. Electronically Signed   By: Tiburcio Pea M.D.   On: 07/16/2023 05:37   CT Cervical Spine Wo Contrast Result Date: 07/16/2023 CLINICAL DATA:  Neck trauma, intoxication with fall. EXAM: CT HEAD WITHOUT CONTRAST CT CERVICAL SPINE WITHOUT CONTRAST TECHNIQUE: Multidetector CT imaging of the head and cervical spine was performed following the standard protocol without intravenous contrast. Multiplanar CT image reconstructions of the cervical spine were also  generated. RADIATION DOSE REDUCTION: This exam was performed according to the departmental dose-optimization program which includes automated exposure control, adjustment of the mA and/or kV according to patient size and/or use of iterative reconstruction technique. COMPARISON:  09/30/2014 FINDINGS: CT HEAD FINDINGS Brain: No evidence of acute infarction, hemorrhage, hydrocephalus, extra-axial collection or mass lesion/mass effect. Vascular: No hyperdense vessel or unexpected calcification. Skull: Remote left frontal fracture depressing the anterior wall of the left frontal sinus. Left-sided scalp swelling. Sinuses/Orbits: Patchy paranasal sinus opacification. There is a left maxillary fluid level that is low-density and likely inflammatory. Chronic posttraumatic deformity to the left frontal sinus. CT CERVICAL SPINE FINDINGS Alignment: No traumatic malalignment Skull base and vertebrae: No acute fracture. No primary bone lesion or focal pathologic process. Soft tissues and spinal canal: No  prevertebral fluid or swelling. No visible canal hematoma. Disc levels:  Generalized degenerative endplate and facet spurring. Upper chest: Clear apical lungs. IMPRESSION: No evidence of acute intracranial or cervical spine injury. Electronically Signed   By: Tiburcio Pea M.D.   On: 07/16/2023 05:37    Procedures .Critical Care  Performed by: Shon Baton, MD Authorized by: Shon Baton, MD   Critical care provider statement:    Critical care time (minutes):  45   Critical care was necessary to treat or prevent imminent or life-threatening deterioration of the following conditions: Hyponatremia.   Critical care was time spent personally by me on the following activities:  Development of treatment plan with patient or surrogate, discussions with consultants, evaluation of patient's response to treatment, examination of patient, ordering and review of laboratory studies, ordering and review of radiographic studies, ordering and performing treatments and interventions, pulse oximetry, re-evaluation of patient's condition and review of old charts     Medications Ordered in ED Medications  acetaminophen (TYLENOL) tablet 1,000 mg (1,000 mg Oral Given 07/16/23 0657)  sodium chloride 0.9 % bolus 1,000 mL (1,000 mLs Intravenous New Bag/Given 07/16/23 0723)  0.9 %  sodium chloride infusion ( Intravenous New Bag/Given 07/16/23 0726)    ED Course/ Medical Decision Making/ A&P Clinical Course as of 07/16/23 0729  Mon Jul 16, 2023  0643 Patient endorses dizziness.  Still cannot provide much history.  States he did drink some beer last night but alcohol level is less than 10.  Spoke to the patient's girlfriend, Toniann Fail.  She reports they have both been sick and he came home from work early last night coughing.  He got up to go to the bathroom and she heard him fall.  No fevers.  Will add COVID, flu testing, basic lab work given patient's dizziness. [CH]  2440 BMP with significant hyponatremia.   Patient states he does not have a history of the same.  Makes me consider seizure as a potential of his loss of consciousness.  Reports that he only drinks 40 ounce beer every other day; however, suspect he may have drink more.  No other obvious cause.  Additional lab work added. [CH]  1027 Spoke to Myrla Halsted, critical care.  He has added CK, lactate, TSH, and cortisol.  Does not recommend hypertonic saline at this point.  Would volume resuscitate and reassess.  Is available via teleconsult to hospitalist to [facility].   [CH]    Clinical Course User Index [CH] Areyana Leoni, Mayer Masker, MD  Medical Decision Making Amount and/or Complexity of Data Reviewed Labs: ordered. Radiology: ordered.  Risk OTC drugs. Prescription drug management. Decision regarding hospitalization.   This patient presents to the ED for concern of loss of consciousness, this involves an extensive number of treatment options, and is a complaint that carries with it a high risk of complications and morbidity.  I considered the following differential and admission for this acute, potentially life threatening condition.  The differential diagnosis includes vagal episodes, arrhythmia, intoxication, acute injury  MDM:    This is a 58 year old male who presents after an episode of loss of consciousness.  Initially reported he had alcohol on board.  He really was not able to tell me much.  He had a noted hematoma to the left forehead.  CT head neck obtained and reassuring.  Alcohol level is negative.  On recheck, he still is very confused about what happened.  Spoke to his girlfriend who indicated they both have had some upper respiratory symptoms.  She states that he got up to go to the bathroom and she heard him fall.  Will add basic lab work, COVID, influenza.  EKG does not show any evidence of obvious arrhythmia.  Patient denies any other drug use.  7:29 AM Significant hyponatremia noted on basic  lab work.  Patient reports every other day alcohol use but no other obvious culprit.  Additional lab work ordered.  He was given fluids.  Will consult with critical care as patient did have an unwitnessed episode of loss of consciousness which could have been a seizure.  He has not had any seizure activity here.  (Labs, imaging, consults)  Labs: I Ordered, and personally interpreted labs.  The pertinent results include: CBC, BMP pending  Imaging Studies ordered: I ordered imaging studies including CT head, cervical spine, ankle x-ray I independently visualized and interpreted imaging. I agree with the radiologist interpretation  Additional history obtained from chart review.  External records from outside source obtained and reviewed including prior evaluations  Cardiac Monitoring: The patient was maintained on a cardiac monitor.  If on the cardiac monitor, I personally viewed and interpreted the cardiac monitored which showed an underlying rhythm of: Sinus  Reevaluation: After the interventions noted above, I reevaluated the patient and found that they have :stayed the same  Social Determinants of Health:  lives independently  Disposition: Admission  Co morbidities that complicate the patient evaluation  Past Medical History:  Diagnosis Date   Fracture of great toe, left, closed    H/O hernia repair    H/O repair of rotator cuff    Knee contracture 11/13/2014   Testosterone deficiency 11/13/2014   Umbilical hernia      Medicines Meds ordered this encounter  Medications   acetaminophen (TYLENOL) tablet 1,000 mg   sodium chloride 0.9 % bolus 1,000 mL   0.9 %  sodium chloride infusion    I have reviewed the patients home medicines and have made adjustments as needed  Problem List / ED Course: Problem List Items Addressed This Visit   None Visit Diagnoses       Loss of consciousness (HCC)    -  Primary     Hyponatremia                       Final  Clinical Impression(s) / ED Diagnoses Final diagnoses:  Loss of consciousness (HCC)  Hyponatremia    Rx / DC Orders ED Discharge Orders  None         Shon Baton, MD 07/16/23 9604    Shon Baton, MD 07/16/23 5409    Shon Baton, MD 07/16/23 0730

## 2023-07-16 NOTE — ED Notes (Signed)
 Pt transported to CT ?

## 2023-07-16 NOTE — ED Notes (Signed)
 Attempted to call report to ICU. Victorino Dike RN to call back for report.

## 2023-07-16 NOTE — Plan of Care (Signed)

## 2023-07-16 NOTE — ED Notes (Signed)
 Called CT to see if pt can get CT before going to ICU. Pt to go to CT.

## 2023-07-17 DIAGNOSIS — E871 Hypo-osmolality and hyponatremia: Secondary | ICD-10-CM | POA: Diagnosis not present

## 2023-07-17 LAB — RESPIRATORY PANEL BY PCR

## 2023-07-17 LAB — CBC
HCT: 39.3 % (ref 39.0–52.0)
Hemoglobin: 13.8 g/dL (ref 13.0–17.0)
MCH: 32.8 pg (ref 26.0–34.0)
MCHC: 35.1 g/dL (ref 30.0–36.0)
MCV: 93.3 fL (ref 80.0–100.0)
Platelets: 206 10*3/uL (ref 150–400)
RBC: 4.21 MIL/uL — ABNORMAL LOW (ref 4.22–5.81)
RDW: 13.2 % (ref 11.5–15.5)
WBC: 6.5 10*3/uL (ref 4.0–10.5)
nRBC: 0 % (ref 0.0–0.2)

## 2023-07-17 LAB — CK
Total CK: 19013 U/L — ABNORMAL HIGH (ref 49–397)
Total CK: 19819 U/L — ABNORMAL HIGH (ref 49–397)
Total CK: 32392 U/L — ABNORMAL HIGH (ref 49–397)

## 2023-07-17 LAB — BASIC METABOLIC PANEL
Anion gap: 10 (ref 5–15)
Anion gap: 8 (ref 5–15)
BUN: 6 mg/dL (ref 6–20)
BUN: 6 mg/dL (ref 6–20)
CO2: 22 mmol/L (ref 22–32)
CO2: 21 mmol/L — ABNORMAL LOW (ref 22–32)
Calcium: 7.9 mg/dL — ABNORMAL LOW (ref 8.9–10.3)
Calcium: 7.8 mg/dL — ABNORMAL LOW (ref 8.9–10.3)
Chloride: 102 mmol/L (ref 98–111)
Chloride: 99 mmol/L (ref 98–111)
Creatinine, Ser: 0.7 mg/dL (ref 0.61–1.24)
Creatinine, Ser: 0.7 mg/dL (ref 0.61–1.24)
GFR, Estimated: >60 mL/min (ref >60)
GFR, Estimated: >60 mL/min (ref >60)
Glucose, Bld: 103 mg/dL — ABNORMAL HIGH (ref 70–99)
Glucose, Bld: 102 mg/dL — ABNORMAL HIGH (ref 70–99)
Potassium: 3.2 mmol/L — ABNORMAL LOW (ref 3.5–5.1)
Potassium: 3.4 mmol/L — ABNORMAL LOW (ref 3.5–5.1)
Sodium: 134 mmol/L — ABNORMAL LOW (ref 135–145)
Sodium: 128 mmol/L — ABNORMAL LOW (ref 135–145)

## 2023-07-17 LAB — MAGNESIUM: Magnesium: 1.9 mg/dL (ref 1.7–2.4)

## 2023-07-17 LAB — SODIUM: Sodium: 130 mmol/L — ABNORMAL LOW (ref 135–145)

## 2023-07-17 MED ORDER — POTASSIUM CHLORIDE CRYS ER 20 MEQ PO TBCR
40.0000 meq | EXTENDED_RELEASE_TABLET | Freq: Once | ORAL | Status: AC
  Administered 2023-07-17: 40 meq via ORAL
  Filled 2023-07-17: qty 2

## 2023-07-17 MED ORDER — LACTATED RINGERS IV SOLN: INTRAVENOUS | Status: DC

## 2023-07-17 NOTE — Plan of Care (Signed)
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
## 2023-07-17 NOTE — Discharge Instructions (Addendum)
  1) drink plenty fluids and avoid dehydration 2) complete abstinence for cocaine advised 3) follow-up provide care physician for repeat liver function test within a week 4)  abstinence from Tobacco advised may use over-the-counter nicotine patch to help you quit smoking  Providers Accepting New Patients in [place], [place]    [facility] [address]  [place], [place] [address] [phone] Accepts most insurances  [facility] [address] [place], [place] [address] [phone] Accepts most insurances  [facility] [address] [place], [place] [address]  [phone] Must meet requirements  [facility] [address] [place], [place] [address] [phone] Accepts most insurances  [facility] [address]  [place], [place] [address] [phone] Accepts most insurances  [facility] [address]   [place], [place]   [phone] Accepts most insurances  [facility])  [phone]  [place], [place] [address] [phone] Accepts most insurances     [facility] [address]  [place], [place] [address] [phone] Accepts most insurances  [facility] [address] [place], [place] [address] [phone] option 1 Accepts Medicaid and [facility] [address]  [place], [place] [address] [phone] Accepts most insurances  Avon Gully, MD [address] [place], [place] [address] [phone] Accepts most insurances  [facility] at [facility] [address]  [place], [place] [address] [phone] Accepts most insurances  [facility] [phone] [address] [place], [place] [address] [phone] Accepts most insurances  [place] [place] [address] [place], [place] [address] [phone]  Accepts most  insurances

## 2023-07-17 NOTE — Progress Notes (Signed)
 [facility] PROGRESS NOTE  NYHEIM SEUFERT (DOB: Aug 16, 1965) GLO:[id number] PCP: Patient, No Pcp Per  Brief Narrative: GEARL BARATTA is a 58 y.o. male with a history of EtOH abuse, tobacco use who presented to the ED 07/16/2023 when is partner called EMS after he was found down at home. He was found to have +influenza A, UDS +cocaine, serum ethanol level negative. Severe (and new) hyponatremia was noted with Na 117. Trauma scans negative. PCCM consulted for admission to ICU, advised admission to [facility] ICU. With isotonic saline, sodium level increased rapidly and has been tempered with hypotonic fluids, DDAVP. Mental status has improved considerably.   Subjective: He has sore muscles all over, but feeling generally better. He's got little appetite but able to take po. Febrile overnight.  Objective: BP 121/70   Pulse 88   Temp 99.1 F (37.3 C) (Oral)   Resp (!) 26   Ht 5\' 2"  (1.575 m)   Wt 58.9 kg   SpO2 95%   BMI 23.75 kg/m   Gen: No distress Pulm: Clear, nonlabored  CV: RRR, no MRG.  GI: Soft, NT, ND, +BS  Neuro: Alert and oriented. No new focal deficits. Ext: Warm, no deformities. Skin: No rashes, lesions or ulcers on visualized skin   Assessment & Plan: Hyponatremia: Hypovolemic component seems to be primary, no typical causes of SIADH noted though elevated urine sodium initially arguing for this. UNa down and sodium level correctly abruptly. Discussed care with PCCM several times, changed to 1/2NS then D5W and then added DDAVP overnight. Na overall 117 > 130 in 24 hours. TSH mildly abnormal as discussed below. Cortisol level 26.4.  - Recheck serially, downgrade to SDU status once confirmed to be stable. - Regular diet   Influenza A:  - Continue tamiflu - Supportive care   Fall at home, rhabdomyolysis: Trauma scan neg. CK peak was delayed, seems to have stabilized at 19k.  - PT eval once more clinically stable - Continue IVF, recheck in AM. Note Cr is wnl. Urine color  normal.   Alcohol abuse: By report. No evidence of intoxication and his drinking appears to follow binge pattern. - CIWA   Incidental pulmonary nodules: Abnormality on CXR in setting of hyponatremia (r/o SIADH due to lung malignancy), CT ordered and shows no predominant nodule correlate. - Repeat CT in 3-6 months   Acute metabolic encephalopathy: Improving.    Abnormal TSH: TSH level 0.308 (LLN 0.35) - Check free T4 and free T3, though likely plan is to repeat TFTs after discharge, not likely to be contributing to hyponatremia.    Cocaine use:  - Will provide cessation counseling  Tyrone Nine, MD [facility] [email or web address] 07/17/2023, 2:59 PM

## 2023-07-18 DIAGNOSIS — F17200 Nicotine dependence, unspecified, uncomplicated: Secondary | ICD-10-CM

## 2023-07-18 DIAGNOSIS — E871 Hypo-osmolality and hyponatremia: Secondary | ICD-10-CM | POA: Diagnosis not present

## 2023-07-18 LAB — BASIC METABOLIC PANEL
Anion gap: 8 (ref 5–15)
BUN: 5 mg/dL — ABNORMAL LOW (ref 6–20)
CO2: 23 mmol/L (ref 22–32)
Calcium: 7.8 mg/dL — ABNORMAL LOW (ref 8.9–10.3)
Chloride: 99 mmol/L (ref 98–111)
Creatinine, Ser: 0.5 mg/dL — ABNORMAL LOW (ref 0.61–1.24)
GFR, Estimated: >60 mL/min (ref >60)
Glucose, Bld: 85 mg/dL (ref 70–99)
Potassium: 3.3 mmol/L — ABNORMAL LOW (ref 3.5–5.1)
Sodium: 130 mmol/L — ABNORMAL LOW (ref 135–145)

## 2023-07-18 LAB — CK: Total CK: >50000 U/L — ABNORMAL HIGH (ref 49–397)

## 2023-07-18 MED ORDER — POTASSIUM CHLORIDE CRYS ER 20 MEQ PO TBCR
40.0000 meq | EXTENDED_RELEASE_TABLET | ORAL | Status: AC
  Administered 2023-07-18 (×2): 40 meq via ORAL
  Filled 2023-07-18 (×2): qty 2

## 2023-07-18 MED ORDER — ALBUTEROL SULFATE (2.5 MG/3ML) 0.083% IN NEBU: 2.5000 mg | INHALATION_SOLUTION | RESPIRATORY_TRACT | Status: DC | PRN

## 2023-07-18 MED ORDER — LORAZEPAM 1 MG PO TABS: 1.0000 mg | ORAL_TABLET | ORAL | Status: AC | PRN

## 2023-07-18 MED ORDER — DM-GUAIFENESIN ER 30-600 MG PO TB12
1.0000 | ORAL_TABLET | Freq: Two times a day (BID) | ORAL | Status: DC
  Administered 2023-07-18 – 2023-07-22 (×9): 1 via ORAL
  Filled 2023-07-18 (×9): qty 1

## 2023-07-18 MED ORDER — LORAZEPAM 2 MG/ML IJ SOLN: 1.0000 mg | INTRAMUSCULAR | Status: AC | PRN

## 2023-07-18 MED ORDER — LACTATED RINGERS IV SOLN: INTRAVENOUS | Status: DC

## 2023-07-18 NOTE — Evaluation (Signed)
 Physical Therapy Brief Evaluation and Discharge Note Patient Details Name: [name] [name] MRN: [id number] DOB: 1966-04-14 Today's Date: 07/18/2023   History of Present Illness  [name] [name] is a 58 y.o. male with a history of EtOH abuse, tobacco use who presented to the ED 07/16/2023 when is partner called EMS after he was found down at home. He was found to have +influenza A, UDS +cocaine, serum ethanol level negative. Severe (and new) hyponatremia was noted with Na 117. Trauma scans negative. PCCM consulted for admission to ICU, advised admission to [facility] ICU. With isotonic saline, sodium level increased rapidly and has been tempered with hypotonic fluids, DDAVP. Mental status has improved considerably.  Clinical Impression  Patient tolerating physical therapy evaluation well.  Gated on purpose for PT evaluation.  Patient understood and agreeable.  Patient demonstrating functional baseline with independent movement at all tested movements.  Patient is performing at functional baseline, no safety concerns noted throughout evaluation.  Patient is safe to DC home with appropriate services, DME, and any other needs indicated.  PT to sign off at this time.  Please reconsult acute PT services if functional changes occur while admitted.  Thank you.        PT Assessment    Assistance Needed at Discharge       Equipment Recommendations None recommended by PT  Recommendations for Other Services       Precautions/Restrictions          Mobility  Bed Mobility          Transfers Overall transfer level: Independent Equipment used: None               General transfer comment: From EOB without AD    Ambulation/Gait Ambulation/Gait assistance: Independent Gait Distance (Feet): 50 Feet Assistive device: IV Pole Gait Pattern/deviations: WFL(Within Functional Limits)   General Gait Details: Ambulating 50 feet with an ICU room pushing IV pole with LUE.  Independent with no loss  of balance, patient demonstrating proper turning and sequencing.  Home Activity Instructions    Stairs            Modified Rankin (Stroke Patients Only)        Balance                          Pertinent Vitals/Pain   Pain Assessment Pain Assessment: 0-10 Pain Score: 6  Pain Location: "everywhere" Pain Descriptors / Indicators: Aching Pain Intervention(s): Monitored during session     Home Living   Living Arrangements: Spouse/significant other       Home Equipment: Agricultural consultant (2 wheels);BSC/3in1;Shower seat        Prior Function        UE/LE Assessment               Communication   Communication Communication: No apparent difficulties     Cognition         General Comments      Exercises     Assessment/Plan    PT Problem List         PT Visit Diagnosis Repeated falls (R29.6)    No Skilled PT Patient is independent with all acitivity/mobility   Co-evaluation                AMPAC 6 Clicks Help needed turning from your back to your side while in a flat bed without using bedrails?: None Help needed moving from lying on your back to  sitting on the side of a flat bed without using bedrails?: None Help needed moving to and from a bed to a chair (including a wheelchair)?: None Help needed standing up from a chair using your arms (e.g., wheelchair or bedside chair)?: None Help needed to walk in hospital room?: None Help needed climbing 3-5 steps with a railing? : None 6 Click Score: 24      End of Session Equipment Utilized During Treatment: Gait belt Activity Tolerance: Patient tolerated treatment well Patient left: in chair;with call bell/phone within reach Nurse Communication: Mobility status PT Visit Diagnosis: Repeated falls (R29.6)     Time: 1345-1400 PT Time Calculation (min) (ACUTE ONLY): 15 min  Charges:   PT Evaluation $PT Eval Low Complexity: 1 Low      Elie Goody, DPT [facility]  [facility] [place] [phone] office   Nelida Meuse  07/18/2023, 2:05 PM

## 2023-07-18 NOTE — Progress Notes (Signed)
 [facility] PROGRESS NOTE  TRUEMAN WORLDS (DOB: July 03, 1965) ZOX:[id number] PCP: Patient, No Pcp Per  Brief Narrative: MAHAD NEWSTROM is a 58 y.o. male with a history of EtOH abuse, tobacco use who presented to the ED 07/16/2023 when is partner called EMS after he was found down at home. He was found to have +influenza A, UDS +cocaine, serum ethanol level negative. Severe (and new) hyponatremia was noted with Na 117. Trauma scans negative. PCCM consulted for admission to ICU, advised admission to [facility] ICU. With isotonic saline, sodium level increased rapidly and has been tempered with hypotonic fluids, DDAVP. Mental status has improved considerably.   Subjective: He has sore muscles all over, but feeling generally better. He's got little appetite but able to take po. Febrile overnight.  Objective: BP 115/76   Pulse 81   Temp 99.3 F (37.4 C) (Oral)   Resp 16   Ht 5\' 2"  (1.575 m)   Wt 58.7 kg   SpO2 97%   BMI 23.67 kg/m   Gen: No distress Pulm: Clear, nonlabored  CV: RRR, no MRG.  GI: Soft, NT, ND, +BS  Neuro: Alert and oriented. No new focal deficits. Ext: Warm, no deformities. Skin: No rashes, lesions or ulcers on visualized skin   Assessment & Plan: 1)Hyponatremia: --Suspect some component of dehydration and hypovolemia Sodium is up to 130 from 117 -Urine sodium was initially elevated -Received IV fluids and DDAVP overnight.  TSH mildly abnormal as discussed below. Cortisol level 26.4.  -Continue IV normal saline and encourage adequate oral intake   2)Influenza A:  -Okay to complete Tamiflu -Bronchodilators and mucolytics as needed   3)Rhabdomyolysis: Due to cocaine abuse compounded by fall/trauma at home -- trauma scan neg. -- -CK 19,013 >> 19, 819 >>32, 392 >> >50,000 -- Continue aggressive IV and oral hydration as CKs are trending up -Consider bicarb drip to alkalinize urine and aid renal excretion  of CKD   4)Alcohol abuse: No DT sxs at this time -Continue  benzos per CIWA protocol Folic acid, thiamine and multivitamin as ordered   5)incidental pulmonary nodules: CT ordered and shows no predominant nodule correlate. -Outpatient repeat CT in 3-6 months   6)Acute metabolic encephalopathy: Improving.    7)Abnormal TSH: TSH level 0.308 (LLN 0.35) - Check free T4 and free T3, though likely plan is to repeat TFTs after discharge, not likely to be contributing to hyponatremia.    8)Cocaine use:  - --Patient is not ready to quit illicit drug use -Declines referral  Shon Hale, MD [facility] [email or web address] 07/18/2023, 1:07 PM

## 2023-07-18 NOTE — Plan of Care (Signed)

## 2023-07-19 DIAGNOSIS — E871 Hypo-osmolality and hyponatremia: Secondary | ICD-10-CM | POA: Diagnosis not present

## 2023-07-19 LAB — COMPREHENSIVE METABOLIC PANEL
ALT: 153 U/L — ABNORMAL HIGH (ref 0–44)
AST: 1104 U/L — ABNORMAL HIGH (ref 15–41)
Albumin: 2.9 g/dL — ABNORMAL LOW (ref 3.5–5.0)
Alkaline Phosphatase: 46 U/L (ref 38–126)
Anion gap: 8 (ref 5–15)
BUN: 6 mg/dL (ref 6–20)
CO2: 24 mmol/L (ref 22–32)
Calcium: 8.4 mg/dL — ABNORMAL LOW (ref 8.9–10.3)
Chloride: 101 mmol/L (ref 98–111)
Creatinine, Ser: 0.57 mg/dL — ABNORMAL LOW (ref 0.61–1.24)
GFR, Estimated: >60 mL/min (ref >60)
Glucose, Bld: 92 mg/dL (ref 70–99)
Potassium: 4.2 mmol/L (ref 3.5–5.1)
Sodium: 133 mmol/L — ABNORMAL LOW (ref 135–145)
Total Bilirubin: 0.6 mg/dL (ref 0.0–1.2)
Total Protein: 5.8 g/dL — ABNORMAL LOW (ref 6.5–8.1)

## 2023-07-19 LAB — CK: Total CK: >50000 U/L — ABNORMAL HIGH (ref 49–397)

## 2023-07-19 MED ORDER — POLYETHYLENE GLYCOL 3350 17 G PO PACK
17.0000 g | PACK | Freq: Two times a day (BID) | ORAL | Status: DC
  Administered 2023-07-19 – 2023-07-22 (×5): 17 g via ORAL
  Filled 2023-07-19 (×7): qty 1

## 2023-07-19 MED ORDER — SODIUM CHLORIDE 0.45 % IV SOLN
INTRAVENOUS | Status: AC
  Filled 2023-07-19 (×3): qty 75

## 2023-07-19 MED ORDER — GUAIFENESIN-DM 100-10 MG/5ML PO SYRP
5.0000 mL | ORAL_SOLUTION | ORAL | Status: DC | PRN
  Administered 2023-07-19: 5 mL via ORAL
  Filled 2023-07-19: qty 5

## 2023-07-19 MED ORDER — SENNOSIDES-DOCUSATE SODIUM 8.6-50 MG PO TABS
2.0000 | ORAL_TABLET | Freq: Every day | ORAL | Status: DC
  Administered 2023-07-19 – 2023-07-21 (×2): 2 via ORAL
  Filled 2023-07-19 (×3): qty 2

## 2023-07-19 NOTE — Progress Notes (Signed)
 [facility] PROGRESS NOTE  [name] [name] (DOB: [birth year]) GEX:[id number] PCP: Patient, No Pcp Per  Brief Narrative: [name] [name] is a 58 y.o. male with a history of EtOH abuse, tobacco use who presented to the ED 07/16/2023 when is partner called EMS after he was found down at home. He was found to have +influenza A, UDS +cocaine, serum ethanol level negative. Severe (and new) hyponatremia was noted with Na 117. Trauma scans negative. PCCM consulted for admission to ICU, advised admission to [facility] ICU. With isotonic saline, sodium level increased rapidly and has been tempered with hypotonic fluids, DDAVP. Mental status has improved considerably.   Subjective: -Eating and  drinking better -Voiding well No fever  Or chills  -improving myalgia  Objective: BP 103/86   Pulse 93   Temp 98 F (36.7 C) (Oral)   Resp 16   Ht 5\' 2"  (1.575 m)   Wt 54.3 kg   SpO2 96%   BMI 21.90 kg/m    Physical Exam Gen:- Awake Alert, in no acute distress  HEENT:- [place].AT, No sclera icterus Neck-Supple Neck,No JVD,.  Lungs-  CTAB , fair air movement bilaterally  CV- S1, S2 normal, RRR Abd-  +ve B.Sounds, Abd Soft, No tenderness,    Extremity/Skin:- No  edema,   good pedal pulses  Psych-affect is appropriate, oriented x3 Neuro-no new focal deficits, no tremors   Assessment & Plan: 1)Hyponatremia: --Suspect some component of dehydration and hypovolemia Sodium is up to 133 from 117 -Urine sodium was initially elevated -Received IV fluids and DDAVP overnight.  TSH mildly abnormal as discussed below. Cortisol level 26.4.  -Encourage adequate oral intake -IV fluids as below #3   2)Influenza A: --No hypoxia -Okay to complete Tamiflu -Bronchodilators and mucolytics as needed   3)Rhabdomyolysis: Due to cocaine abuse compounded by fall/trauma at home -- trauma scan neg. -- -CK 19,013 >> 19, 819 >>32, 392 >> >50,000>> > 50,000 -- Continue aggressive IV and oral hydration as CKs are trending  up -Start Bicarb drip to alkalinize urine and aid renal excretion of CKD   4)Alcohol abuse: No DT sxs at this time -Continue benzos per CIWA protocol Folic acid, thiamine and multivitamin as ordered   5)Incidental pulmonary nodules: CT ordered and shows no predominant nodule correlate. -Outpatient repeat CT in 3-6 months   6)Acute metabolic encephalopathy: Resolved   7)Abnormal TSH: TSH level 0.308 (LLN 0.35) - Check free T4 and free T3, though likely plan is to repeat TFTs after discharge, not likely to be contributing to hyponatremia.    8)Transaminitis---AST >>> ALT --suggestive of alcoholic hepatitis compounded by rhabdomyolysis AST 1,104  ALT 153 -Alk phos and T. bili WNL  9)Cocaine use:  - --Patient is not ready to quit illicit drug use -Declines referral  [name] Hale, MD [facility] [email or web address] 07/19/2023, 10:01 AM

## 2023-07-19 NOTE — Plan of Care (Signed)

## 2023-07-20 DIAGNOSIS — E871 Hypo-osmolality and hyponatremia: Secondary | ICD-10-CM | POA: Diagnosis not present

## 2023-07-20 LAB — COMPREHENSIVE METABOLIC PANEL
ALT: 175 U/L — ABNORMAL HIGH (ref 0–44)
AST: 1098 U/L — ABNORMAL HIGH (ref 15–41)
Albumin: 2.9 g/dL — ABNORMAL LOW (ref 3.5–5.0)
Alkaline Phosphatase: 48 U/L (ref 38–126)
Anion gap: 10 (ref 5–15)
BUN: 6 mg/dL (ref 6–20)
CO2: 28 mmol/L (ref 22–32)
Calcium: 8.4 mg/dL — ABNORMAL LOW (ref 8.9–10.3)
Chloride: 97 mmol/L — ABNORMAL LOW (ref 98–111)
Creatinine, Ser: 0.74 mg/dL (ref 0.61–1.24)
GFR, Estimated: >60 mL/min (ref >60)
Glucose, Bld: 127 mg/dL — ABNORMAL HIGH (ref 70–99)
Potassium: 4.2 mmol/L (ref 3.5–5.1)
Sodium: 135 mmol/L (ref 135–145)
Total Bilirubin: 0.6 mg/dL (ref 0.0–1.2)
Total Protein: 5.7 g/dL — ABNORMAL LOW (ref 6.5–8.1)

## 2023-07-20 LAB — CK: Total CK: >50000 U/L — ABNORMAL HIGH (ref 49–397)

## 2023-07-20 MED ORDER — SODIUM CHLORIDE 0.45 % IV SOLN
INTRAVENOUS | Status: AC
  Filled 2023-07-20 (×2): qty 75

## 2023-07-20 NOTE — Progress Notes (Signed)
 [facility] PROGRESS NOTE  [name] [name] (DOB: 1966-04-09) ZOX:[id number] PCP: Patient, No Pcp Per  Brief Narrative: [name] [name] is a 58 y.o. male with a history of EtOH abuse, tobacco use who presented to the ED 07/16/2023 when is partner called EMS after he was found down at home. He was found to have +influenza A, UDS +cocaine, serum ethanol level negative. Severe (and new) hyponatremia was noted with Na 117. Trauma scans negative. PCCM consulted for admission to ICU, advised admission to [facility] ICU. With isotonic saline, sodium level increased rapidly and has been tempered with hypotonic fluids, DDAVP. Mental status has improved considerably.   Subjective: - Resting comfortably, no new concerns -Eating, drinking and voiding well  Objective: BP 105/72 (BP Location: Left Arm)   Pulse 77   Temp 99.2 F (37.3 C) (Oral)   Resp 18   Ht 5\' 2"  (1.575 m)   Wt 54.7 kg   SpO2 96%   BMI 22.06 kg/m    Physical Exam Gen:- Awake Alert, in no acute distress  HEENT:- [place].AT, No sclera icterus Neck-Supple Neck,No JVD,.  Lungs-  CTAB , fair air movement bilaterally  CV- S1, S2 normal, RRR Abd-  +ve B.Sounds, Abd Soft, No tenderness,    Extremity/Skin:- No  edema,   good pedal pulses  Psych-affect is appropriate, oriented x3 Neuro-no new focal deficits, no tremors   Assessment & Plan: 1)Hyponatremia: --Suspect some component of dehydration and hypovolemia Sodium is up to 133 from 117 -Urine sodium was initially elevated -Received IV fluids and DDAVP overnight.  TSH mildly abnormal as discussed below. Cortisol level 26.4.  -Encourage adequate oral intake -IV fluids as below #3   2)Influenza A: --No hypoxia -Okay to complete Tamiflu -Bronchodilators and mucolytics as needed   3)Rhabdomyolysis: Due to cocaine abuse compounded by fall/trauma at home -- trauma scan neg. --- --Clinically no compartment syndrome -CK 19,013 >> 19, 819 >>32, 392 >> >50,000>> > 50,000 -- Continue  aggressive IV and oral hydration as CKs are trending up -Start Bicarb drip to alkalinize urine and aid renal excretion of CKD   4)Alcohol abuse: No DT sxs at this time -Continue benzos per CIWA protocol Folic acid, thiamine and multivitamin as ordered   5)Incidental pulmonary nodules: CT ordered and shows no predominant nodule correlate. -Outpatient repeat CT in 3-6 months   6)Acute metabolic encephalopathy: Resolved   7)Abnormal TSH: TSH level 0.308 (LLN 0.35) - Check free T4 and free T3, though likely plan is to repeat TFTs after discharge, not likely to be contributing to hyponatremia.    8)Transaminitis---AST >>> ALT --suggestive of alcoholic hepatitis compounded by rhabdomyolysis AST 1,104 >>1098 ALT 153>>175 -Alk phos and T. bili WNL -Check acute viral hepatitis profile =-Check right upper quadrant ultrasound - 9)Cocaine use:  - --Patient is not ready to quit illicit drug use -Declines referral  Shon Hale, MD [facility] [email or web address] 07/20/2023, 6:47 PM

## 2023-07-20 NOTE — Progress Notes (Signed)
 Alert and oriented.  Has shown no signs of withdrawal

## 2023-07-20 NOTE — Plan of Care (Signed)

## 2023-07-21 ENCOUNTER — Inpatient Hospital Stay (HOSPITAL_COMMUNITY): Payer: No Typology Code available for payment source

## 2023-07-21 DIAGNOSIS — E871 Hypo-osmolality and hyponatremia: Secondary | ICD-10-CM | POA: Diagnosis not present

## 2023-07-21 LAB — COMPREHENSIVE METABOLIC PANEL
ALT: 172 U/L — ABNORMAL HIGH (ref 0–44)
AST: 924 U/L — ABNORMAL HIGH (ref 15–41)
Albumin: 3 g/dL — ABNORMAL LOW (ref 3.5–5.0)
Alkaline Phosphatase: 49 U/L (ref 38–126)
Anion gap: 9 (ref 5–15)
BUN: 7 mg/dL (ref 6–20)
CO2: 25 mmol/L (ref 22–32)
Calcium: 8.2 mg/dL — ABNORMAL LOW (ref 8.9–10.3)
Chloride: 102 mmol/L (ref 98–111)
Creatinine, Ser: 0.59 mg/dL — ABNORMAL LOW (ref 0.61–1.24)
GFR, Estimated: >60 mL/min (ref >60)
Glucose, Bld: 154 mg/dL — ABNORMAL HIGH (ref 70–99)
Potassium: 3.7 mmol/L (ref 3.5–5.1)
Sodium: 136 mmol/L (ref 135–145)
Total Bilirubin: 0.6 mg/dL (ref 0.0–1.2)
Total Protein: 5.8 g/dL — ABNORMAL LOW (ref 6.5–8.1)

## 2023-07-21 LAB — HEPATITIS PANEL, ACUTE
HCV Ab: NONREACTIVE
Hep A IgM: NONREACTIVE
Hep B C IgM: NONREACTIVE
Hepatitis B Surface Ag: NONREACTIVE

## 2023-07-21 LAB — CK: Total CK: 30775 U/L — ABNORMAL HIGH (ref 49–397)

## 2023-07-21 MED ORDER — SODIUM CHLORIDE 0.9 % IV SOLN: INTRAVENOUS | Status: DC

## 2023-07-21 NOTE — Plan of Care (Signed)
  Problem: Education: Goal: Knowledge of General Education information will improve Description: Including pain rating scale, medication(s)/side effects and non-pharmacologic comfort measures Outcome: Progressing   Problem: Health Behavior/Discharge Planning: Goal: Ability to manage health-related needs will improve Outcome: Progressing   Problem: Clinical Measurements: Goal: Will remain free from infection Outcome: Progressing Goal: Respiratory complications will improve Outcome: Progressing Goal: Cardiovascular complication will be avoided Outcome: Progressing   Problem: Clinical Measurements: Goal: Ability to maintain clinical measurements within normal limits will improve Outcome: Not Progressing Goal: Diagnostic test results will improve Outcome: Not Progressing

## 2023-07-21 NOTE — Progress Notes (Signed)
 [facility] PROGRESS NOTE  [name] [name] (DOB: 11-Nov-1965) GMW:[id number] PCP: Patient, No Pcp Per  Brief Narrative: [name] [name] is a 58 y.o. male with a history of EtOH abuse, tobacco use who presented to the ED 07/16/2023 when is partner called EMS after he was found down at home. He was found to have +influenza A, UDS +cocaine, serum ethanol level negative. Severe (and new) hyponatremia was noted with Na 117. Trauma scans negative. PCCM consulted for admission to ICU, advised admission to [facility] ICU. With isotonic saline, sodium level increased rapidly and has been tempered with hypotonic fluids, DDAVP. Mental status has improved considerably.   Subjective: - Resting comfortably, no new concerns -Eating, drinking and voiding well  Objective: BP 95/61 (BP Location: Left Arm)   Pulse 78   Temp 98.4 F (36.9 C) (Oral)   Resp 16   Ht 5\' 2"  (1.575 m)   Wt 54.7 kg   SpO2 98%   BMI 22.06 kg/m    Physical Exam Gen:- Awake Alert, in no acute distress  HEENT:- [place].AT, No sclera icterus Neck-Supple Neck,No JVD,.  Lungs-  CTAB , fair air movement bilaterally  CV- S1, S2 normal, RRR Abd-  +ve B.Sounds, Abd Soft, No tenderness,    Extremity/Skin:- No  edema,   good pedal pulses  Psych-affect is appropriate, oriented x3 Neuro-no new focal deficits, no tremors   Assessment & Plan: 1)Hyponatremia: --Suspect some component of dehydration and hypovolemia Sodium is up to 133 from 117 -Urine sodium was initially elevated -Received IV fluids and DDAVP this admission TSH mildly abnormal as discussed below. Cortisol level 26.4.  -Encourage adequate oral intake -IV fluids as below #3   2)Influenza A: --No hypoxia -Okay to complete Tamiflu -Bronchodilators and mucolytics as needed   3)Rhabdomyolysis: Due to cocaine abuse compounded by fall/trauma at home -- trauma scan neg. --- --Clinically no compartment syndrome -CK 19,013 >> 19, 819 >>32, 392 >> >50,000>> > 50,000>>50,000 >>  30, 775 -- Continue aggressive IV and oral hydration  -Initially treated with bicarb drip to alkalinize urine and aid renal excretion of CKD -Okay to switch to normal saline   4)Alcohol abuse: No DT sxs at this time -Continue benzos per CIWA protocol Folic acid, thiamine and multivitamin as ordered   5)Incidental pulmonary nodules: CT ordered and shows no predominant nodule correlate. -Outpatient repeat CT in 3-6 months   6)Acute metabolic encephalopathy: Resolved   7)Abnormal TSH: TSH level 0.308 (LLN 0.35) -not likely to be contributing to hyponatremia.  -Okay to repeat as outpatient once clinically stable   8)Transaminitis---AST >>> ALT --suggestive of alcoholic hepatitis compounded by rhabdomyolysis AST 1,104 >>1098>>924 ALT 153>>175>>172 -Alk phos and T. bili WNL - acute viral hepatitis profile--Neg  right upper quadrant ultrasound unremarkable - 9)Cocaine use:  - --Patient is not ready to quit illicit drug use -Declines referral  Shon Hale, MD [facility] [email or web address] 07/21/2023, 5:27 PM

## 2023-07-22 DIAGNOSIS — E871 Hypo-osmolality and hyponatremia: Secondary | ICD-10-CM | POA: Diagnosis not present

## 2023-07-22 LAB — COMPREHENSIVE METABOLIC PANEL
ALT: 141 U/L — ABNORMAL HIGH (ref 0–44)
AST: 555 U/L — ABNORMAL HIGH (ref 15–41)
Albumin: 2.9 g/dL — ABNORMAL LOW (ref 3.5–5.0)
Alkaline Phosphatase: 44 U/L (ref 38–126)
Anion gap: 7 (ref 5–15)
BUN: 9 mg/dL (ref 6–20)
CO2: 23 mmol/L (ref 22–32)
Calcium: 8 mg/dL — ABNORMAL LOW (ref 8.9–10.3)
Chloride: 105 mmol/L (ref 98–111)
Creatinine, Ser: 0.68 mg/dL (ref 0.61–1.24)
GFR, Estimated: >60 mL/min (ref >60)
Glucose, Bld: 104 mg/dL — ABNORMAL HIGH (ref 70–99)
Potassium: 3.8 mmol/L (ref 3.5–5.1)
Sodium: 135 mmol/L (ref 135–145)
Total Bilirubin: 0.3 mg/dL (ref 0.0–1.2)
Total Protein: 5.7 g/dL — ABNORMAL LOW (ref 6.5–8.1)

## 2023-07-22 LAB — CK: Total CK: 10177 U/L — ABNORMAL HIGH (ref 49–397)

## 2023-07-22 MED ORDER — NICOTINE 21 MG/24HR TD PT24: 21.0000 mg | MEDICATED_PATCH | Freq: Every day | TRANSDERMAL | 0 refills | Status: AC | PRN

## 2023-07-22 MED ORDER — ACETAMINOPHEN 325 MG PO TABS
650.0000 mg | ORAL_TABLET | Freq: Four times a day (QID) | ORAL | Status: AC | PRN
Start: 2023-07-22 — End: ?

## 2023-07-22 NOTE — Progress Notes (Signed)
 Went over discharge instructions w/ pt.

## 2023-07-22 NOTE — Discharge Summary (Signed)
 [name] ROHLEDER, is a 58 y.o. male  DOB 1966/04/01  MRN [id number].  Admission date:  07/16/2023  Admitting Physician  Tyrone Nine, MD  Discharge Date:  07/22/2023   Primary MD  Patient, No Pcp Per  Recommendations for primary care physician for things to follow:  1)Drink plenty fluids and avoid dehydration 2)Complete abstinence for cocaine advised 3)Follow-up provide care physician for repeat liver function test within a week 4)Abstinence from Tobacco advised may use over-the-counter nicotine patch to help you quit smoking  Admission Diagnosis  Hyponatremia [E87.1] Loss of consciousness (HCC) [R40.20]   Discharge Diagnosis  Hyponatremia [E87.1] Loss of consciousness (HCC) [R40.20]    Principal Problem:   Hyponatremia      Past Medical History:  Diagnosis Date   Fracture of great toe, left, closed    H/O hernia repair    H/O repair of rotator cuff    Knee contracture 11/13/2014   Testosterone deficiency 11/13/2014   Umbilical hernia     Past Surgical History:  Procedure Laterality Date   CAPSULOTOMY Left 11/12/2014   Procedure: LEFT KNEE MANIPULATION , CAPSULOTOMY;  Surgeon: Myrene Galas, MD;  Location: [facility] OR;  Service: Orthopedics;  Laterality: Left;   EXCISIONAL TOTAL HIP ARTHROPLASTY WITH ANTIBIOTIC SPACERS Left 11/12/2014   Procedure: REMOVAL OF ANTIBIOTIC SPACERS;  Surgeon: Myrene Galas, MD;  Location: [facility] OR;  Service: Orthopedics;  Laterality: Left;   EXTERNAL FIXATION LEG Left 09/30/2014   Procedure: EXTERNAL FIXATION LEG;  Surgeon: Samson Frederic, MD;  Location: [facility] OR;  Service: Orthopedics;  Laterality: Left;   HERNIA REPAIR     HIP CLOSED REDUCTION Left 09/30/2014   Procedure: CLOSED REDUCTION HIP;  Surgeon: Samson Frederic, MD;  Location: [facility] OR;  Service: Orthopedics;  Laterality: Left;   I & D EXTREMITY Left 09/30/2014   Procedure: IRRIGATION AND DEBRIDEMENT EXTREMITY;  Surgeon: Samson Frederic, MD;  Location: [facility] OR;  Service: Orthopedics;  Laterality: Left;   I & D EXTREMITY Left 10/02/2014   Procedure: IRRIGATION AND DEBRIDEMENT LEFT FEMUR FRACTURE;  Surgeon: Myrene Galas, MD;  Location: [facility] OR;  Service: Orthopedics;  Laterality: Left;   OPEN REDUCTION INTERNAL FIXATION (ORIF) DISTAL RADIAL FRACTURE Right 11/11/2016   Procedure: Open reduction internal fixation right radial shaft fracture;  Surgeon: Bradly Bienenstock, MD;  Location: [facility] OR;  Service: Orthopedics;  Laterality: Right;  90 mins   ORIF ACETABULAR FRACTURE Left 10/02/2014   Procedure: OPEN REDUCTION INTERNAL FIXATION (ORIF) LEFT ACETABULAR FRACTURE;  Surgeon: Myrene Galas, MD;  Location: [facility] OR;  Service: Orthopedics;  Laterality: Left;   ORIF FEMUR FRACTURE Left 11/12/2014   Procedure: LEFT FEMUR NON UNION REPAIR ;  Surgeon: Myrene Galas, MD;  Location: [facility] OR;  Service: Orthopedics;  Laterality: Left;   ROTATOR CUFF REPAIR       HPI  from the history and physical done on the day of admission:     HPI: ZYIERE [name] is a 58 y.o. male with a history of EtOH abuse, tobacco use who presented to the ED  when is partner called EMS after he was found down at home. He reports he drank a 40oz of beer, which is his usual but is unable to give much more history. He had a headache when he got here. He and his partner have had cough but no fevers over the past couple days. He was found to have +influenza A, UDS +cocaine, serum ethanol level negative. Severe (and new) hyponatremia was noted with Na 117. Trauma scans negative. PCCM consulted for admission to ICU, advised admission to [facility] ICU. History otherwise limited to EDP discussion, documentation, and patient's incomplete recollection.    Review of Systems: unable to review all systems due to the inability of the patient to answer questions.   Hospital Course:   1)Hyponatremia: --Suspect some component of dehydration and hypovolemia Sodium is up to 135 from 117 -Urine sodium  was initially elevated -Received IV fluids and DDAVP this admission TSH mildly abnormal as discussed below. Cortisol level 26.4.  -Encourage adequate oral intake   2)Influenza A: --No hypoxia completed Tamiflu -Bronchodilators and mucolytics as needed   3)Rhabdomyolysis: Due to cocaine abuse compounded by fall/trauma at home -- trauma scan neg. --- --Clinically no compartment syndrome -CK 19,013 >> 19, 819 >>32, 392 >> >50,000>> > 50,000>>50,000 >> 30, 775>>10,177 -Initially treated with bicarb drip to alkalinize urine and aid renal excretion of CKD -Subsequently received IV normal saline -Overall much improved -No significant myalgia    4)Alcohol abuse: No DT sxs at this time -Abstinence from alcohol, multivitamin advised   5)Incidental pulmonary nodules: CT ordered and shows no predominant nodule correlate. -Outpatient repeat CT in 3-6 months advised   6)Acute metabolic encephalopathy: Resolved, mentation is back to normal   7)Abnormal TSH: TSH level 0.308 (LLN 0.35) -not likely to be contributing to hyponatremia.  -Okay to repeat as outpatient once clinically stable   8)Transaminitis---AST >>> ALT --suggestive of alcoholic hepatitis compounded by rhabdomyolysis AST 1,104 >>1098>>924>>555 ALT 153>>175>>172>>141 -Alk phos and T. bili WNL    Latest Ref Rng & Units 07/22/2023    2:13 PM 07/21/2023    8:34 AM 07/20/2023    9:05 AM  Hepatic Function  Total Protein 6.5 - 8.1 g/dL 5.7  5.8  5.7   Albumin 3.5 - 5.0 g/dL 2.9  3.0  2.9   AST 15 - 41 U/L 555  924  1,098   ALT 0 - 44 U/L 141  172  175   Alk Phosphatase 38 - 126 U/L 44  49  48   Total Bilirubin 0.0 - 1.2 mg/dL 0.3  0.6  0.6    - acute viral hepatitis profile--Neg  right upper quadrant ultrasound unremarkable - 9)Cocaine use:  - --Patient is not ready to quit illicit drug use -Declines referral   Discharge Condition: stable  Diet and Activity recommendation:  As advised  Discharge Instructions   Discharge  Instructions     Call MD for:  difficulty breathing, headache or visual disturbances   Complete by: As directed    Call MD for:  extreme fatigue   Complete by: As directed    Call MD for:  persistant dizziness or light-headedness   Complete by: As directed    Call MD for:  persistant nausea and vomiting   Complete by: As directed    Call MD for:  severe uncontrolled pain   Complete by: As directed    Call MD for:  temperature >100.4   Complete by: As directed    Diet - low sodium  heart healthy   Complete by: As directed    Discharge instructions   Complete by: As directed    1) drink plenty fluids and avoid dehydration 2) complete abstinence for cocaine advised 3) follow-up provide care physician for repeat liver function test within a week 4)  abstinence from Tobacco advised may use over-the-counter nicotine patch to help you quit smoking   Increase activity slowly   Complete by: As directed          Discharge Medications     Allergies as of 07/22/2023   No Known Allergies      Medication List     STOP taking these medications    ibuprofen 600 MG tablet Commonly known as: ADVIL       TAKE these medications    acetaminophen 325 MG tablet Commonly known as: TYLENOL Take 2 tablets (650 mg total) by mouth every 6 (six) hours as needed for mild pain (pain score 1-3) (or Fever >/= 101).   multivitamin with iron-minerals liquid Take by mouth daily.   nicotine 21 mg/24hr patch Commonly known as: NICODERM CQ - dosed in mg/24 hours Place 1 patch (21 mg total) onto the skin daily as needed (nicotine withdrawal).        Major procedures and Radiology Reports - PLEASE review detailed and final reports for all details, in brief -   US Abdomen Limited RUQ (LIVER/GB) Result Date: 07/21/2023 CLINICAL DATA:  Elevated liver function tests Ethanol abuse EXAM: ULTRASOUND ABDOMEN LIMITED RIGHT UPPER QUADRANT COMPARISON:  None available FINDINGS: Gallbladder: Gallstones:  None Sludge: None Gallbladder Wall: Within normal limits Pericholecystic fluid: None Sonographic Murphy's Sign: Negative per technologist Common bile duct: Diameter: 4 mm Liver: Parenchymal echogenicity: Within normal limits Contours: Normal Lesions: None Portal vein: Patent.  Hepatopetal flow Other: None. IMPRESSION: No significant sonographic abnormality of the liver or gallbladder. Electronically Signed   By: Acquanetta Belling M.D.   On: 07/21/2023 10:00   CT CHEST WO CONTRAST Result Date: 07/16/2023 CLINICAL DATA:  Respiratory illness, nondiagnostic xray Lung nodule, > 8mm. EXAM: CT CHEST WITHOUT CONTRAST TECHNIQUE: Multidetector CT imaging of the chest was performed following the standard protocol without IV contrast. RADIATION DOSE REDUCTION: This exam was performed according to the departmental dose-optimization program which includes automated exposure control, adjustment of the mA and/or kV according to patient size and/or use of iterative reconstruction technique. COMPARISON:  CT scan chest from 09/30/2014. FINDINGS: Cardiovascular: Normal cardiac size. No pericardial effusion. No aortic aneurysm. Mediastinum/Nodes: Visualized thyroid gland appears grossly unremarkable. No solid / cystic mediastinal masses. The esophagus is nondistended precluding optimal assessment. There are few mildly prominent mediastinal lymph nodes, which do not meet the size criteria for lymphadenopathy and appear grossly similar to the prior study, favoring benign etiology. No axillary lymphadenopathy by size criteria. Evaluation of bilateral hila is limited due to lack on intravenous contrast: however, no large hilar lymphadenopathy identified. Lungs/Pleura: The central tracheo-bronchial tree is patent. There is mild, smooth, circumferential thickening of the segmental and subsegmental bronchial walls, throughout bilateral lungs, which is nonspecific. Findings are most commonly seen with bronchitis or reactive airway disease, such  as asthma. Minimal upper lobe predominant paraseptal emphysematous changes noted, right more than left. There are dependent changes in bilateral lungs. No mass or consolidation. No pleural effusion or pneumothorax. There are multiple solid noncalcified nodules in the right lung lower lobe with largest measuring up to 5 x 7 mm (mean diameter 6 mm). These are new since the prior study from  2016. Please see follow-up recommendations below. There is no lung nodule corresponding to the nodular density described in the left parahilar region on the x-ray performed earlier the same day. Findings likely corresponds to a vessel. Upper Abdomen: There is probable tiny sliding hiatal hernia. Visualized upper abdominal viscera within normal limits. Musculoskeletal: The visualized soft tissues of the chest wall are grossly unremarkable. No suspicious osseous lesions. There are mild multilevel degenerative changes in the visualized spine. IMPRESSION: 1. No discrete lung nodule seen corresponding to the nodular density described in the left parahilar region on the x-ray performed earlier the same day. Findings likely corresponds to a vessel. However, there are multiple solid noncalcified nodules in the right lung lower lobe with largest measuring up to 5 x 7 mm. Please see follow-up recommendations below. 2. No lung mass, consolidation, pleural effusion or pneumothorax. 3. Multiple other nonacute observations, as described above. Emphysema (ICD10-J43.9). Pulmonary nodule follow-up recommendation: Solid lung nodules measuring 6-8 mm: - LOW RISK:CT at 3-6 months, then consider CT at 18-24 months. - HIGHER RISK: CT at 3-6 months, then at 18-24 months. Recommendations for evaluation of incidental nodules derived from guidelines developed by the Fleischner Society ( Radiology 2017; [phone]). These guidelines apply to incidental nodules, which can be managed according to the specific recommendations. These guidelines do not apply to  patient's younger than 35 years, immunocompromised patients, or patients with cancer. For lung cancer screening, adherence to the existing [employer] of radiology lung CT Screening Reporting and Data System (lung-RADS) guidelines is recommended. High risk factors include older age, heavy smoking, larger nodule size, irregular or spiculated margins and upper lobe location. Clinical risk factors include smoking, exposure to other carcinogens, emphysema, fibrosis, upper lobe location, family history of lung cancer, age and sex. Electronically Signed   By: Jules Schick M.D.   On: 07/16/2023 10:19   DG Chest Portable 1 View Result Date: 07/16/2023 CLINICAL DATA:  Cough. EXAM: PORTABLE CHEST 1 VIEW COMPARISON:  08/14/2018 FINDINGS: Cardiopericardial silhouette is at upper limits of normal for size. There is pulmonary vascular congestion without overt pulmonary edema. Nodular density in the left parahilar region appears to be new in the interval. No acute bony abnormality. Telemetry leads overlie the chest. IMPRESSION: 1. Pulmonary vascular congestion without overt pulmonary edema. 2. Nodular density in the left parahilar region appears to be new in the interval. CT chest without contrast recommended to further evaluate. Electronically Signed   By: Kennith Center M.D.   On: 07/16/2023 07:59   DG Ankle Complete Right Result Date: 07/16/2023 CLINICAL DATA:  Fall related to alcohol consumption. EXAM: RIGHT ANKLE - COMPLETE 3 VIEW COMPARISON:  None Available. FINDINGS: There is no evidence of fracture, dislocation, or joint effusion. There is no evidence of arthropathy or other focal bone abnormality. Soft tissues are unremarkable. IMPRESSION: Negative. Electronically Signed   By: Tiburcio Pea M.D.   On: 07/16/2023 07:02   CT Head Wo Contrast Result Date: 07/16/2023 CLINICAL DATA:  Neck trauma, intoxication with fall. EXAM: CT HEAD WITHOUT CONTRAST CT CERVICAL SPINE WITHOUT CONTRAST TECHNIQUE: Multidetector  CT imaging of the head and cervical spine was performed following the standard protocol without intravenous contrast. Multiplanar CT image reconstructions of the cervical spine were also generated. RADIATION DOSE REDUCTION: This exam was performed according to the departmental dose-optimization program which includes automated exposure control, adjustment of the mA and/or kV according to patient size and/or use of iterative reconstruction technique. COMPARISON:  09/30/2014 FINDINGS: CT HEAD FINDINGS Brain: No  evidence of acute infarction, hemorrhage, hydrocephalus, extra-axial collection or mass lesion/mass effect. Vascular: No hyperdense vessel or unexpected calcification. Skull: Remote left frontal fracture depressing the anterior wall of the left frontal sinus. Left-sided scalp swelling. Sinuses/Orbits: Patchy paranasal sinus opacification. There is a left maxillary fluid level that is low-density and likely inflammatory. Chronic posttraumatic deformity to the left frontal sinus. CT CERVICAL SPINE FINDINGS Alignment: No traumatic malalignment Skull base and vertebrae: No acute fracture. No primary bone lesion or focal pathologic process. Soft tissues and spinal canal: No prevertebral fluid or swelling. No visible canal hematoma. Disc levels:  Generalized degenerative endplate and facet spurring. Upper chest: Clear apical lungs. IMPRESSION: No evidence of acute intracranial or cervical spine injury. Electronically Signed   By: Tiburcio Pea M.D.   On: 07/16/2023 05:37   CT Cervical Spine Wo Contrast Result Date: 07/16/2023 CLINICAL DATA:  Neck trauma, intoxication with fall. EXAM: CT HEAD WITHOUT CONTRAST CT CERVICAL SPINE WITHOUT CONTRAST TECHNIQUE: Multidetector CT imaging of the head and cervical spine was performed following the standard protocol without intravenous contrast. Multiplanar CT image reconstructions of the cervical spine were also generated. RADIATION DOSE REDUCTION: This exam was performed  according to the departmental dose-optimization program which includes automated exposure control, adjustment of the mA and/or kV according to patient size and/or use of iterative reconstruction technique. COMPARISON:  09/30/2014 FINDINGS: CT HEAD FINDINGS Brain: No evidence of acute infarction, hemorrhage, hydrocephalus, extra-axial collection or mass lesion/mass effect. Vascular: No hyperdense vessel or unexpected calcification. Skull: Remote left frontal fracture depressing the anterior wall of the left frontal sinus. Left-sided scalp swelling. Sinuses/Orbits: Patchy paranasal sinus opacification. There is a left maxillary fluid level that is low-density and likely inflammatory. Chronic posttraumatic deformity to the left frontal sinus. CT CERVICAL SPINE FINDINGS Alignment: No traumatic malalignment Skull base and vertebrae: No acute fracture. No primary bone lesion or focal pathologic process. Soft tissues and spinal canal: No prevertebral fluid or swelling. No visible canal hematoma. Disc levels:  Generalized degenerative endplate and facet spurring. Upper chest: Clear apical lungs. IMPRESSION: No evidence of acute intracranial or cervical spine injury. Electronically Signed   By: Tiburcio Pea M.D.   On: 07/16/2023 05:37    Micro Results   Recent Results (from the past 240 hours)  Resp panel by RT-PCR (RSV, Flu A&B, Covid) Anterior Nasal Swab     Status: Abnormal   Collection Time: 07/16/23  6:58 AM   Specimen: Anterior Nasal Swab  Result Value Ref Range Status   SARS Coronavirus 2 by RT PCR NEGATIVE NEGATIVE Final    Comment: (NOTE) SARS-CoV-2 target nucleic acids are NOT DETECTED.  The SARS-CoV-2 RNA is generally detectable in upper respiratory specimens during the acute phase of infection. The lowest concentration of SARS-CoV-2 viral copies this assay can detect is 138 copies/mL. A negative result does not preclude SARS-Cov-2 infection and should not be used as the sole basis for  treatment or other patient management decisions. A negative result may occur with  improper specimen collection/handling, submission of specimen other than nasopharyngeal swab, presence of viral mutation(s) within the areas targeted by this assay, and inadequate number of viral copies(<138 copies/mL). A negative result must be combined with clinical observations, patient history, and epidemiological information. The expected result is Negative.  Fact Sheet for Patients:  BloggerCourse.com  Fact Sheet for Healthcare Providers:  SeriousBroker.it  This test is no t yet approved or cleared by the [place] FDA and  has been authorized for detection and/or diagnosis  of SARS-CoV-2 by FDA under an Emergency Use Authorization (EUA). This EUA will remain  in effect (meaning this test can be used) for the duration of the COVID-19 declaration under Section 564(b)(1) of the Act, 21 U.S.C.section 360bbb-3(b)(1), unless the authorization is terminated  or revoked sooner.       Influenza A by PCR POSITIVE (A) NEGATIVE Final   Influenza B by PCR NEGATIVE NEGATIVE Final    Comment: (NOTE) The Xpert Xpress SARS-CoV-2/FLU/RSV plus assay is intended as an aid in the diagnosis of influenza from Nasopharyngeal swab specimens and should not be used as a sole basis for treatment. Nasal washings and aspirates are unacceptable for Xpert Xpress SARS-CoV-2/FLU/RSV testing.  Fact Sheet for Patients: BloggerCourse.com  Fact Sheet for Healthcare Providers: SeriousBroker.it  This test is not yet approved or cleared by the [place] FDA and has been authorized for detection and/or diagnosis of SARS-CoV-2 by FDA under an Emergency Use Authorization (EUA). This EUA will remain in effect (meaning this test can be used) for the duration of the COVID-19 declaration under Section 564(b)(1) of the Act, 21  U.S.C. section 360bbb-3(b)(1), unless the authorization is terminated or revoked.     Resp Syncytial Virus by PCR NEGATIVE NEGATIVE Final    Comment: (NOTE) Fact Sheet for Patients: BloggerCourse.com  Fact Sheet for Healthcare Providers: SeriousBroker.it  This test is not yet approved or cleared by the [place] FDA and has been authorized for detection and/or diagnosis of SARS-CoV-2 by FDA under an Emergency Use Authorization (EUA). This EUA will remain in effect (meaning this test can be used) for the duration of the COVID-19 declaration under Section 564(b)(1) of the Act, 21 U.S.C. section 360bbb-3(b)(1), unless the authorization is terminated or revoked.  Performed at [facility], [address], [place], [place] [address]   MRSA Next Gen by PCR, Nasal     Status: None   Collection Time: 07/16/23  9:10 AM   Specimen: Nasal Mucosa; Nasal Swab  Result Value Ref Range Status   MRSA by PCR Next Gen NOT DETECTED NOT DETECTED Final    Comment: (NOTE) The GeneXpert MRSA Assay (FDA approved for NASAL specimens only), is one component of a comprehensive MRSA colonization surveillance program. It is not intended to diagnose MRSA infection nor to guide or monitor treatment for MRSA infections. Test performance is not FDA approved in patients less than 55 years old. Performed at [facility], [address], [place], [place] [address]   Respiratory (~20 pathogens) panel by PCR     Status: Abnormal   Collection Time: 07/17/23  2:32 PM   Specimen: Nasopharyngeal Swab; Respiratory  Result Value Ref Range Status   Adenovirus NOT DETECTED NOT DETECTED Final   Coronavirus 229E NOT DETECTED NOT DETECTED Final    Comment: (NOTE) The Coronavirus on the Respiratory Panel, DOES NOT test for the novel  Coronavirus (2019 nCoV)    Coronavirus HKU1 NOT DETECTED NOT DETECTED Final   Coronavirus NL63 NOT DETECTED NOT DETECTED Final    Coronavirus OC43 NOT DETECTED NOT DETECTED Final   Metapneumovirus NOT DETECTED NOT DETECTED Final   Rhinovirus / Enterovirus NOT DETECTED NOT DETECTED Final   Influenza A H1 2009 DETECTED (A) NOT DETECTED Final   Influenza B NOT DETECTED NOT DETECTED Final   Parainfluenza Virus 1 NOT DETECTED NOT DETECTED Final   Parainfluenza Virus 2 NOT DETECTED NOT DETECTED Final   Parainfluenza Virus 3 NOT DETECTED NOT DETECTED Final   Parainfluenza Virus 4 NOT DETECTED NOT DETECTED  Final   Respiratory Syncytial Virus NOT DETECTED NOT DETECTED Final   Bordetella pertussis NOT DETECTED NOT DETECTED Final   Bordetella Parapertussis NOT DETECTED NOT DETECTED Final   Chlamydophila pneumoniae NOT DETECTED NOT DETECTED Final   Mycoplasma pneumoniae NOT DETECTED NOT DETECTED Final    Comment: Performed at [facility], [address] [place], [place] [address]    Today   Subjective    Deontre Allsup today has no new complaints No fever  Or chills   No Nausea, Vomiting or Diarrhea Ambulating without dyspnea on exertion no hypoxia         Patient has been seen and examined prior to discharge   Objective   Blood pressure (!) 93/58, pulse 70, temperature 97.7 F (36.5 C), temperature source Oral, resp. rate 14, height 5\' 2"  (1.575 m), weight 54.7 kg, SpO2 98%.   Intake/Output Summary (Last 24 hours) at 07/22/2023 1658 Last data filed at 07/22/2023 1300 Gross per 24 hour  Intake 960 ml  Output 1400 ml  Net -440 ml    Exam Gen:- Awake Alert, no acute distress  HEENT:- [place].AT, No sclera icterus Neck-Supple Neck,No JVD,.  Lungs-  CTAB , good air movement bilaterally CV- S1, S2 normal, regular Abd-  +ve B.Sounds, Abd Soft, No tenderness,    Extremity/Skin:- No  edema,   good pulses Psych-affect is appropriate, oriented x3 Neuro-no new focal deficits, no tremors    Data Review   CBC w Diff:  Lab Results  Component Value Date   WBC 6.5 07/17/2023   HGB 13.8 07/17/2023   HGB 14.9  02/24/2022   HCT 39.3 07/17/2023   HCT 44.5 02/24/2022   PLT 206 07/17/2023   PLT 309 02/24/2022   LYMPHOPCT 6 07/16/2023   MONOPCT 17 07/16/2023   EOSPCT 0 07/16/2023   BASOPCT 1 07/16/2023    CMP:  Lab Results  Component Value Date   NA 135 07/22/2023   NA 140 02/24/2022   K 3.8 07/22/2023   CL 105 07/22/2023   CO2 23 07/22/2023   BUN 9 07/22/2023   BUN 10 02/24/2022   CREATININE 0.68 07/22/2023   PROT 5.7 (L) 07/22/2023   PROT 6.9 02/24/2022   ALBUMIN 2.9 (L) 07/22/2023   ALBUMIN 4.5 02/24/2022   BILITOT 0.3 07/22/2023   BILITOT 0.8 02/24/2022   ALKPHOS 44 07/22/2023   AST 555 (H) 07/22/2023   ALT 141 (H) 07/22/2023  .  Total Discharge time is about 33 minutes  Shon Hale M.D on 07/22/2023 at 4:58 PM  Go to [email or web address] -  for contact info  [facility] - Office  [phone]

## 2023-07-22 NOTE — Plan of Care (Signed)
   Problem: Education: Goal: Knowledge of General Education information will improve Description: Including pain rating scale, medication(s)/side effects and non-pharmacologic comfort measures Outcome: Progressing   Problem: Health Behavior/Discharge Planning: Goal: Ability to manage health-related needs will improve Outcome: Progressing   Problem: Clinical Measurements: Goal: Will remain free from infection Outcome: Progressing Goal: Diagnostic test results will improve Outcome: Progressing Goal: Respiratory complications will improve Outcome: Progressing Goal: Cardiovascular complication will be avoided Outcome: Progressing   Problem: Activity: Goal: Risk for activity intolerance will decrease Outcome: Progressing   Problem: Nutrition: Goal: Adequate nutrition will be maintained Outcome: Progressing   Problem: Coping: Goal: Level of anxiety will decrease Outcome: Progressing   Problem: Elimination: Goal: Will not experience complications related to bowel motility Outcome: Progressing Goal: Will not experience complications related to urinary retention Outcome: Progressing   Problem: Pain Managment: Goal: General experience of comfort will improve and/or be controlled Outcome: Progressing   Problem: Safety: Goal: Ability to remain free from injury will improve Outcome: Progressing   Problem: Skin Integrity: Goal: Risk for impaired skin integrity will decrease Outcome: Progressing

## 2023-07-22 NOTE — Plan of Care (Signed)
